# Patient Record
Sex: Female | Born: 1975 | Race: Black or African American | Hispanic: No | Marital: Married | Smoking: Never smoker
Health system: Southern US, Community
[De-identification: ages and names within clinical notes are randomized; demographics above are authoritative.]

## PROBLEM LIST (undated history)

## (undated) DIAGNOSIS — R7303 Prediabetes: Secondary | ICD-10-CM

## (undated) DIAGNOSIS — M1990 Unspecified osteoarthritis, unspecified site: Secondary | ICD-10-CM

## (undated) DIAGNOSIS — E559 Vitamin D deficiency, unspecified: Secondary | ICD-10-CM

## (undated) DIAGNOSIS — M549 Dorsalgia, unspecified: Secondary | ICD-10-CM

## (undated) HISTORY — DX: Unspecified osteoarthritis, unspecified site: M19.90

## (undated) HISTORY — DX: Prediabetes: R73.03

## (undated) HISTORY — PX: CHOLECYSTECTOMY: SHX55

## (undated) HISTORY — DX: Vitamin D deficiency, unspecified: E55.9

## (undated) HISTORY — PX: OTHER SURGICAL HISTORY: SHX169

## (undated) HISTORY — DX: Morbid (severe) obesity due to excess calories: E66.01

---

## 2000-02-21 ENCOUNTER — Inpatient Hospital Stay (HOSPITAL_COMMUNITY): Admission: AD | Admit: 2000-02-21 | Discharge: 2000-02-21 | Payer: Self-pay | Admitting: Obstetrics

## 2000-02-21 ENCOUNTER — Encounter: Payer: Self-pay | Admitting: Obstetrics & Gynecology

## 2000-02-26 ENCOUNTER — Other Ambulatory Visit: Admission: RE | Admit: 2000-02-26 | Discharge: 2000-02-26 | Payer: Self-pay | Admitting: Obstetrics

## 2000-05-13 ENCOUNTER — Inpatient Hospital Stay (HOSPITAL_COMMUNITY): Admission: AD | Admit: 2000-05-13 | Discharge: 2000-05-13 | Payer: Self-pay | Admitting: Obstetrics

## 2000-06-02 ENCOUNTER — Inpatient Hospital Stay (HOSPITAL_COMMUNITY): Admission: AD | Admit: 2000-06-02 | Discharge: 2000-06-02 | Payer: Self-pay | Admitting: Obstetrics

## 2000-06-08 ENCOUNTER — Inpatient Hospital Stay (HOSPITAL_COMMUNITY): Admission: AD | Admit: 2000-06-08 | Discharge: 2000-06-10 | Payer: Self-pay | Admitting: Obstetrics

## 2001-12-27 ENCOUNTER — Inpatient Hospital Stay (HOSPITAL_COMMUNITY): Admission: AD | Admit: 2001-12-27 | Discharge: 2001-12-27 | Payer: Self-pay | Admitting: Obstetrics & Gynecology

## 2001-12-27 ENCOUNTER — Encounter: Payer: Self-pay | Admitting: Obstetrics

## 2002-01-28 ENCOUNTER — Other Ambulatory Visit: Admission: RE | Admit: 2002-01-28 | Discharge: 2002-01-28 | Payer: Self-pay | Admitting: Obstetrics and Gynecology

## 2002-04-12 ENCOUNTER — Inpatient Hospital Stay (HOSPITAL_COMMUNITY): Admission: AD | Admit: 2002-04-12 | Discharge: 2002-04-12 | Payer: Self-pay | Admitting: Obstetrics and Gynecology

## 2002-05-24 ENCOUNTER — Inpatient Hospital Stay (HOSPITAL_COMMUNITY): Admission: AD | Admit: 2002-05-24 | Discharge: 2002-05-24 | Payer: Self-pay | Admitting: Obstetrics and Gynecology

## 2002-06-14 ENCOUNTER — Inpatient Hospital Stay (HOSPITAL_COMMUNITY): Admission: AD | Admit: 2002-06-14 | Discharge: 2002-06-14 | Payer: Self-pay | Admitting: Obstetrics and Gynecology

## 2002-06-20 ENCOUNTER — Inpatient Hospital Stay (HOSPITAL_COMMUNITY): Admission: AD | Admit: 2002-06-20 | Discharge: 2002-06-24 | Payer: Self-pay | Admitting: Obstetrics and Gynecology

## 2002-06-20 ENCOUNTER — Encounter (INDEPENDENT_AMBULATORY_CARE_PROVIDER_SITE_OTHER): Payer: Self-pay | Admitting: Specialist

## 2002-06-21 ENCOUNTER — Encounter: Payer: Self-pay | Admitting: Obstetrics and Gynecology

## 2002-06-22 ENCOUNTER — Encounter: Payer: Self-pay | Admitting: Obstetrics and Gynecology

## 2002-06-23 ENCOUNTER — Encounter: Payer: Self-pay | Admitting: Obstetrics and Gynecology

## 2002-06-27 ENCOUNTER — Encounter: Payer: Self-pay | Admitting: Obstetrics and Gynecology

## 2002-06-27 ENCOUNTER — Inpatient Hospital Stay (HOSPITAL_COMMUNITY): Admission: AD | Admit: 2002-06-27 | Discharge: 2002-06-27 | Payer: Self-pay | Admitting: Obstetrics and Gynecology

## 2002-11-06 ENCOUNTER — Emergency Department (HOSPITAL_COMMUNITY): Admission: EM | Admit: 2002-11-06 | Discharge: 2002-11-06 | Payer: Self-pay | Admitting: Emergency Medicine

## 2003-06-29 ENCOUNTER — Emergency Department (HOSPITAL_COMMUNITY): Admission: AD | Admit: 2003-06-29 | Discharge: 2003-06-29 | Payer: Self-pay | Admitting: Emergency Medicine

## 2006-02-02 ENCOUNTER — Emergency Department (HOSPITAL_COMMUNITY): Admission: EM | Admit: 2006-02-02 | Discharge: 2006-02-02 | Payer: Self-pay | Admitting: Family Medicine

## 2006-08-11 ENCOUNTER — Emergency Department (HOSPITAL_COMMUNITY): Admission: EM | Admit: 2006-08-11 | Discharge: 2006-08-11 | Payer: Self-pay | Admitting: Emergency Medicine

## 2007-11-30 ENCOUNTER — Emergency Department (HOSPITAL_COMMUNITY): Admission: EM | Admit: 2007-11-30 | Discharge: 2007-11-30 | Payer: Self-pay | Admitting: Emergency Medicine

## 2008-06-14 ENCOUNTER — Emergency Department (HOSPITAL_COMMUNITY): Admission: EM | Admit: 2008-06-14 | Discharge: 2008-06-14 | Payer: Self-pay | Admitting: Emergency Medicine

## 2008-06-17 ENCOUNTER — Inpatient Hospital Stay (HOSPITAL_COMMUNITY): Admission: EM | Admit: 2008-06-17 | Discharge: 2008-06-19 | Payer: Self-pay | Admitting: Emergency Medicine

## 2008-06-18 ENCOUNTER — Encounter (INDEPENDENT_AMBULATORY_CARE_PROVIDER_SITE_OTHER): Payer: Self-pay | Admitting: General Surgery

## 2009-04-11 ENCOUNTER — Emergency Department (HOSPITAL_COMMUNITY): Admission: EM | Admit: 2009-04-11 | Discharge: 2009-04-11 | Payer: Self-pay | Admitting: Family Medicine

## 2009-08-07 ENCOUNTER — Emergency Department (HOSPITAL_COMMUNITY): Admission: EM | Admit: 2009-08-07 | Discharge: 2009-08-07 | Payer: Self-pay | Admitting: Family Medicine

## 2009-11-08 ENCOUNTER — Emergency Department (HOSPITAL_COMMUNITY): Admission: EM | Admit: 2009-11-08 | Discharge: 2009-11-08 | Payer: Self-pay | Admitting: Emergency Medicine

## 2010-08-07 ENCOUNTER — Emergency Department (HOSPITAL_COMMUNITY): Admission: EM | Admit: 2010-08-07 | Discharge: 2010-08-07 | Payer: Self-pay | Admitting: Family Medicine

## 2011-02-13 LAB — CULTURE, ROUTINE-ABSCESS

## 2011-03-04 LAB — DIFFERENTIAL
Basophils Relative: 1 % (ref 0–1)
Eosinophils Absolute: 0.2 10*3/uL (ref 0.0–0.7)
Eosinophils Relative: 2 % (ref 0–5)
Monocytes Relative: 9 % (ref 3–12)
Neutrophils Relative %: 57 % (ref 43–77)

## 2011-03-04 LAB — POCT I-STAT, CHEM 8
Calcium, Ion: 1.17 mmol/L (ref 1.12–1.32)
Creatinine, Ser: 0.6 mg/dL (ref 0.4–1.2)
Glucose, Bld: 97 mg/dL (ref 70–99)
Potassium: 4 mEq/L (ref 3.5–5.1)
TCO2: 27 mmol/L (ref 0–100)

## 2011-03-04 LAB — CBC
HCT: 36 % (ref 36.0–46.0)
MCHC: 34 g/dL (ref 30.0–36.0)
MCV: 87.9 fL (ref 78.0–100.0)
RBC: 4.1 MIL/uL (ref 3.87–5.11)

## 2011-03-07 LAB — GLUCOSE, CAPILLARY: Glucose-Capillary: 129 mg/dL — ABNORMAL HIGH (ref 70–99)

## 2011-03-11 LAB — CULTURE, ROUTINE-ABSCESS: Culture: NO GROWTH

## 2011-04-15 NOTE — H&P (Signed)
Christina Booth, Christina Booth NO.:  [id number]   MEDICAL RECORD NO.:  [id number]          PATIENT TYPE:  INP   LOCATION:  5123                         FACILITY:  [facility]   PHYSICIAN:  Lennie Muckle, MD      DATE OF BIRTH:  1976/02/04   DATE OF ADMISSION:  06/17/2008  DATE OF DISCHARGE:                              HISTORY & PHYSICAL   DIAGNOSIS:  Acute cholecystitis.   HISTORY OF PRESENT ILLNESS:  Ms. Steller is a 35 year old female who  apparently began having abdominal pain located in the right upper  quadrant on Wednesday.  She did have a CT scan was which revealed  cholelithiasis with no abdominal wall thickening.  She received IV  medications and oral medications and was discharged home.  She said the  pain continued in the location in the right upper quadrant, did radiate  to her lower back, intermittently was increased at times.  Did not feel  it was increased by eating certain foods.  She did began to have chills  at home and also had associated nausea.  Repeat CT today revealed a  gallbladder wall thickening.  Ultrasound did not reveal any significant  wall thickening, but she did have numerous stones in gallbladder wall,  the common bile duct was normal at 5.9.   PAST MEDICAL HISTORY:  Negative.   SURGICAL HISTORY:  She did have a C-section.   FAMILY HISTORY:  Hyperlipidemia.   MEDICATIONS:  None.   ALLERGIES:  No allergies.   REVIEW OF SYSTEMS:  Negative.   PHYSICAL EXAMINATION:  VITAL SIGNS:  Original temperature at 2:20 was  100.7, 7:30 was 98.5, pulse 83, blood pressure 121/70.  GENERAL:  She is an obese female laying in bed, appears uncomfortable.  HEENT EXAM:  Extraocular muscles are intact and icteric.  Oral mucosa is  moist.  Chest is clear to auscultation bilaterally.  CARDIOVASCULAR EXAM:  Regular rate and rhythm.  ABDOMEN:  Obese, soft.  It is tender in the right upper quadrant, worse  with deep palpation.   LABORATORY DATA:  White count  9.8, hemoglobin and hematocrit 12 and 35,  BUN and creatinine 5 and 0.9.  Liver enzymes, her AST, ALT are mildly  elevated at 59 and 43, bilirubin 1.2, albumin 3.4.  UA is cloudy.  There  is a small amount of bilirubin, large amount of blood.   ASSESSMENT/PLAN:  Acute cholecystitis, admit with IV antibiotics of  Cipro.  Have discussed laparoscopic cholecystectomy with the patient  likely proceed to the operating room tomorrow or the following day.  She  will be kept n.p.o. until then and resuscitate with IV fluids.  Repeat  labs tomorrow to ensure that there is no other trend up in liver  enzymes.      Lennie Muckle, MD  Electronically Signed     ALA/MEDQ  D:  06/17/2008  T:  06/18/2008  Job:  [id number]

## 2011-04-15 NOTE — Op Note (Signed)
NAMESHINEKA, AUBLE NO.:  [id number]   MEDICAL RECORD NO.:  [id number]          PATIENT TYPE:  INP   LOCATION:  5123                         FACILITY:  [facility]   PHYSICIAN:  Ollen Gross. Vernell Morgans, M.D. DATE OF BIRTH:  1976/07/18   DATE OF PROCEDURE:  06/18/2008  DATE OF DISCHARGE:                               OPERATIVE REPORT   PREOPERATIVE DIAGNOSES:  Cholecystitis with cholelithiasis.   POSTOPERATIVE DIAGNOSES:  Cholecystitis with cholelithiasis.   PROCEDURE:  Laparoscopic cholecystectomy with intraoperative  cholangiogram.   SURGEON:  Ollen Gross. Vernell Morgans, MD   ASSISTANT:  Adolph Pollack, MD   ANESTHESIA:  General endotracheal.   PROCEDURE:  After informed consent was obtained, the patient was brought  to the operating room and placed in supine position on the operating  table.  After induction of general anesthesia, the patient's abdomen was  prepped with Betadine and draped in usual sterile manner.  The area  above the umbilicus was infiltrated with 0.25% Marcaine.  Small incision  was made with 15 blade knife.  This incision was carried down through  the subcutaneous tissue bluntly with the hemostat and [employer]  retractors until the linea alba was identified.  The linea alba was  incised with a 15 blade knife and each side was grasped with Kocher  clamps and elevated anteriorly.  The preperitoneal space was then probed  bluntly with hemostat until the peritoneum was opened and access was  gained to the abdominal cavity.  A 0 Vicryl pursestring stitch was  placed in the fascia around the opening.  Xylocaine was placed through  the opening and anchored in place with previously placed Vicryl  pursestring stitch.  The abdomen was then insufflated with carbon  dioxide without difficulty.  The patient was placed in reverse  Trendelenburg position rotated with the right side up.  A laparoscope  was inserted through the Xylocaine and the right upper quadrant  was  inspected and the gallbladder appeared to be very inflamed with  adhesions of the omentum to it.  Next, the epigastric region was  infiltrated with 0.25% Marcaine.  A small incision was made with 15  blade knife and a 10-mm port was placed bluntly through this incision  into the abdominal cavity under direct vision.  Sites were then chosen  laterally on the right side of the abdomen for placement of 5-mm ports.  Each of these areas were then infiltrated with 0.25% Marcaine.  Small  stab incisions were made with a 15-blade knife and 5-mm ports were  placed bluntly through these incisions into the abdominal cavity under  direct vision.  A blunt grasper was placed through the lateral most 5-mm  port.  The gallbladder was too tense and inflamed to grasp.  A suction  aspirating device was then placed through the other 5-mm port and used  to puncture the dome of the gallbladder.  The contents of the  gallbladder were then aspirated without difficulty.  This allowed [place] to  be able to grasp the gallbladder and elevated anteriorly and superiorly.  The suction aspirator was removed and another  blunt grasper was placed  through the other 5-mm port and used to retract on the body and neck of  the gallbladder.  A dissector was placed through the epigastric port.  Using blunt dissection, the adhesions of the omentum to the body of the  gallbladder were broken up.  We were able to then identify the neck area  of the gallbladder, and using a blunt hydrodissection, we were able to  bluntly identify the cystic duct gallbladder neck junction as well as  the cystic artery posterior to this.  Blunt dissection was carried out  circumferentially until a good window was created.  Two clips were  placed proximally and one clip distally on the cystic artery.  A clip  was also placed on the gallbladder neck.  A small ductotomy was made  just below the clip.  A 14-gauge Angiocath was placed percutaneously   through the anterior abdominal wall under direct vision.  A Reddick  cholangiogram catheter was placed through the Angiocath and flushed.  The Reddick catheter was then placed within the cystic duct and anchored  in place with the clip.  The cholangiogram was obtained that showed no  filling defects, good emptying in duodenum, and short but adequate  cystic duct.  The anchoring clips and catheters were removed from the  patient.  Three clips were placed proximally in the cystic duct and duct  was divided between the two sets of clips.  The cystic artery was also  divided between the two sets of clips.  Next, a laparoscopic hook  cautery device was used to separate the gallbladder from liver bed.  Prior to completely detaching the gallbladder from liver bed, the liver  bed was inspected and several small bleeding points were coagulated with  electrocautery until the area was completely hemostatic.  The  gallbladder was then detached from the risk away from the liver bed  without difficulty.  A laparoscopic bag was inserted through the  epigastric port.  The gallbladder was placed in the bag and bag was  sealed.  The abdomen was then irrigated with copious amounts of saline  until the effluent was clear.  The laparoscope was then moved.  The  epigastric port of gallbladder grasper was placed through the Hasson  cannula and used to grasp the opening of the bag.  The bag with the  gallbladder was removed through the supraumbilical port without much  difficulty, although we did have to extend the incision just slightly  another millimeter or two to get it out.  The fascial defect was then  closed with a previously placed Vicryl pursestring stitch as well as  with another figure-of-eight 0 Vicryl stitch.  The rest of the ports  were then removed under direct vision.  The gas was allowed to escape.  This skin incisions were all closed with interrupted 4-0 Monocryl  subcuticular stitches and  Dermabond dressings were applied.  The patient  tolerated the procedure well.  At the end of the case, all needle,  sponge, and instrument counts were correct.  The patient was then  awakened and taken to recovery room in stable condition.      Ollen Gross. Vernell Morgans, M.D.  Electronically Signed     PST/MEDQ  D:  06/18/2008  T:  06/19/2008  Job:  [id number]

## 2011-04-18 NOTE — Discharge Summary (Signed)
NAMEMERYN, SARRACINO                       ACCOUNT NO.:  [id number]   MEDICAL RECORD NO.:  [id number]                   PATIENT TYPE:   LOCATION:                                       FACILITY:  [facility]   PHYSICIAN:  Rudy Jew. Ashley Royalty, M.D.             DATE OF BIRTH:  1976/04/01   DATE OF ADMISSION:  06/20/2002  DATE OF DISCHARGE:  06/24/2002                                 DISCHARGE SUMMARY   DISCHARGE DIAGNOSES:  1. Intrauterine pregnancy at 39+ weeks gestation, delivered.  2. History of macrosomia with shoulder dystocia at vaginal delivery,     including brachial plexus injury.  3. History of Kell antibody during current pregnancy with titers     insufficient to warrant amniocentesis.  4. Desire for attempt at permanent surgical sterilization.  5. Atelectasis - improved.   SPECIAL PROCDURES:  1. Primary low transverse cesarean section.  2. Bilateral tubal sterilization procedure.   CONSULTATIONS:  None.   DISCHARGE MEDICATIONS:  1. Codeine 30 mg q.4-6h. p.r.n. pain.  2. Ibuprofen 600 mg q.6h. p.r.n. pain.   HISTORY AND PHYSICAL:  This is a 35 year old gravida 4 para 3, 39+ weeks  gestation with the afore-mentioned risk factors.  The patient was admitted  for primary low transverse cesarean section and bilateral tubal  sterilization procedure.   HOSPITAL COURSE:  The patient was admitted to [facility]  [place].  Admission laboratory studies were drawn.  On June 20, 2002 she  was taken to the operating room and underwent primary low transverse  cesarean section and bilateral tubal sterilization procedure.  The procedure  yielded a 7 pound 9 ounce female, Apgars 7 at one minute, 9 at five minutes,  sent to newborn nursery.  The patient's postoperative course was  characterized by a transient fever.  X-ray revealed atelectasis.  Antibiotics were initiated and the patient subsequently defervesced.  On  June 24, 2002 she was felt stable for discharge and  discharged home in  satisfactory condition.   ACCESSORY CLINICAL FINDINGS:  Hemoglobin and hematocrit on admission were  9.7 and 30.8 respectively.  Repeat values were obtained throughout the  hospitalization, the last which were obtained on June 23, 2002 and were 9.6  and 29.5 respectively.  White blood count did not exceed 10,000 at any time.  Urine culture was negative.  X-ray  on June 22, 2002 revealed atelectasis or early infiltrates in the right lung  base.  This improved over subsequent studies.   DISPOSITION:  The patient is to return to [facility] in four to six  weeks for evaluation.                                                James A. Ashley Royalty, M.D.    JAM/MEDQ  D:  09/20/2002  T:  09/21/2002  Job:  [id number]

## 2011-04-18 NOTE — Op Note (Signed)
[facility]  Patient:    Christina Booth, Christina Booth Visit Number: [id number] MRN: [id number]          Service Type: OBS Location: 910A 9102 [id number] Attending Physician:  Wandalee Ferdinand Dictated by:   Rudy Jew Ashley Royalty, M.D. Proc. Date: 06/20/02 Admit Date:  06/20/2002                             Operative Report  PREOPERATIVE DIAGNOSES:       1. Intrauterine pregnancy at 39+ weeks                                  gestation.                               2. 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                               3. Desire for attempt at permanent surgical                                  sterilization.  POSTOPERATIVE DIAGNOSES:      1. Intrauterine pregnancy at 39+ weeks                                  gestation.                               2. 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                               3. Desire for attempt at permanent surgical                                  sterilization.  PROCEDURE:                    1. Primary low transverse cesarean section.                               2. Bilateral tubal sterilization procedure,                                  [place].  SURGEON:                      Rudy Jew. Ashley Royalty, M.D.  ASSISTANT:  Ronda Fairly. Galen Daft, M.D.  ANESTHESIA:                   Spinal.  ESTIMATED BLOOD LOSS:         800 cc.  FINDINGS:                     A 7 pound 9 ounce female.  Apgars 7 at one minute, 9 at five minutes.  Sent to the newborn nursery.  COMPLICATIONS:                None.  PACKS AND DRAINS:             Foley.  COUNTS:                       Sponge, needle, and instrument count reported as correct x2.  PROCEDURE:                    The patient was taken to the operating room, placed in the sitting position.  After spinal  anesthesia was administered she was placed in the dorsal supine position, prepped and draped in the usual manner for abdominal surgery.  Foley catheter was placed.  A Pfannenstiel incision was made down to the level of the fascia.  The fascia was nicked in the midline and incised transversely with Mayo scissors.  The underlying rectus muscles were separated from the fascia using sharp and blunt dissection.  The rectus muscles were separated in the midline exposing the peritoneum which was elevated with hemostats and entered atraumatically with Metzenbaum scissors.  The incision was extended longitudinally.  The uterus was identified and the bladder flap created by incising the intrauterine serosa and sharply and bluntly dissecting the bladder inferiorly.  It was held in place with the bladder blade.  The uterus was then entered through a low transverse incision using sharp and blunt dissection.  Fluid was noted to be clear.  The infant was delivered from the vertex presentation in an atraumatic manner.  The infant was suctioned.  The cord was doubly clamped, cut, and the infant given immediately to the waiting pediatrics team.  Cord blood was obtained.  The placenta and membranes were removed in their entirety and submitted to pathology for histologic studies.  Uterus was exteriorized.  The uterus was then closed with two running layers of #1 Vicryl, the first using a running locking layer.  The second was a running, intermittently locking, and embrocating layer.  Two additional figure-of-eight sutures were required to obtain hemostasis.  Hemostasis was noted.  Attention was turned to the tubal sterilization procedure.  The left fallopian tube was grasped with the Babcock clamp and traced to its fimbriated end.  An avascular area in the distal isthmic to proximal ampullary portion was chosen for ligation.  A #1 plain catgut suture was placed around a segment of tube in this area and tied  securely.  A second ligature was tied in the same area as well.  The intervening portion of fallopian tube was excised with Metzenbaum scissors and submitted to pathology for histologic studies.  The right fallopian tube was grasped with a Babcock clamp and traced to its fimbriated end.  An avascular area in the distal isthmic proximal ampullary portion was chosen for ligation.  A #1 plain catgut suture was placed as a ligature around this segment of tube.  A second ligature was placed securely.  The intervening portion  of fallopian tube was excised with Metzenbaum scissors and submitted to pathology for histologic studies.  Hemostasis was noted.  The remainder of the uterus and ovaries were inspected and found to be normal.  The uterus was then returned to the abdominal cavity.  Copious irrigation was accomplished. The incision was dry.  The tubal sterilization sites were dry as well.  Next, the rectus muscles were reapproximated with 2-0 Vicryl in an interrupted fashion.  The fascia was closed with 0 Vicryl in a running fashion.  The skin was closed with staples.  The patient tolerated the procedure extremely well. At the conclusion of the procedure the urine was clear and copious. Dictated by:   Rudy Jew Ashley Royalty, M.D. Attending Physician:  Wandalee Ferdinand DD:  06/20/02 TD:  06/23/02 Job: [id number]

## 2011-04-18 NOTE — H&P (Signed)
[facility]  Patient:    Christina Booth, Christina Booth Visit Number: [id number] MRN: [id number]          Service Type: OBS Location: 910A 9102 01 Attending Physician:  Wandalee Ferdinand Dictated by:   Rudy Jew Ashley Royalty, M.D. Admit Date:  06/20/2002                           History and Physical  HISTORY OF PRESENT ILLNESS:   This is a 35 year old, gravida 4, para 3, EDC June 25, 2002 placing her currently at 39 plus weeks gestation. Prenatal care was complicated by an antibody screen February 2003 which revealed a "nonspecific cold" antibody. Subsequent identification revealed Kell. However, subsequent titers were sufficiently low as to not require amniocentesis. The patient also has a history of a spontaneous vaginal delivery with shoulder dystocia including a brachioplexus injury. She also states a desire for attempt at permanent surgical sterilization.  MEDICATIONS:                  Vitamins.  PAST MEDICAL HISTORY:         Medical negative. Surgical negative.  ALLERGIES:                    Negative.  FAMILY HISTORY:               Positive for hypertension, diabetes, and breast cancer.  SOCIAL HISTORY:               The patient denies the use of tobacco or significant alcohol.  REVIEW OF SYSTEMS:            Noncontributory.  PHYSICAL EXAMINATION:  GENERAL:                      Reveals a pleasant female in no acute distress.  VITAL SIGNS:                  Afebrile, vital signs stable.  SKIN:                         Warm and dry without lesions.  LYMPH NODES:                  No supraclavicular, cervical or inguinal adenopathy.  HEENT:                        Normocephalic.  NECK:                         Sore without thyromegaly.  CHEST:                        Lungs are clear.  CARDIAC:                      Regular rate and rhythm without murmurs, gallops or rubs.  BREASTS:                      Deferred.  ABDOMEN:                       Gravid with a term fundal height. Fetal heart tones are auscultated.  MUSCULOSKELETAL:              No CVA tenderness.  PELVIC:                       Deferred until examination under anesthesia.  IMPRESSION:                   1. Intrauterine pregnancy at 39 plus weeks                                  gestation.                               2. History of macrosomia with shoulder dystocia                                  at vaginal delivery including brachioplexus                                  injury.                               3. History of Kell antibody during current                                  pregnancy with titers insufficient to warrant                                  amniocentesis.                               4. Desire for attempt at permanent surgical                                  sterilization.  PLAN:                         1. Primary low transverse cesarean section.                               2. Bilateral tubal sterilization procedure.  The risks, benefits, complications, and alternatives were fully discussed with the patient. Permanency and failure rates of tubal sterilization procedure were discussed and accepted. Questions invited and answered. Dictated by:   Rudy Jew Ashley Royalty, M.D. Attending Physician:  Wandalee Ferdinand DD:  06/20/02 TD:  06/20/02 Job: [id number]

## 2011-07-31 ENCOUNTER — Emergency Department (HOSPITAL_COMMUNITY): Payer: Self-pay

## 2011-07-31 ENCOUNTER — Emergency Department (HOSPITAL_COMMUNITY)
Admission: EM | Admit: 2011-07-31 | Discharge: 2011-07-31 | Disposition: A | Discharge status: Home / Self Care | Payer: Self-pay | Attending: Emergency Medicine | Admitting: Emergency Medicine

## 2011-07-31 DIAGNOSIS — R319 Hematuria, unspecified: Secondary | ICD-10-CM | POA: Insufficient documentation

## 2011-07-31 DIAGNOSIS — R0602 Shortness of breath: Secondary | ICD-10-CM | POA: Insufficient documentation

## 2011-07-31 DIAGNOSIS — R197 Diarrhea, unspecified: Secondary | ICD-10-CM | POA: Insufficient documentation

## 2011-07-31 DIAGNOSIS — E669 Obesity, unspecified: Secondary | ICD-10-CM | POA: Insufficient documentation

## 2011-07-31 DIAGNOSIS — R5381 Other malaise: Secondary | ICD-10-CM | POA: Insufficient documentation

## 2011-07-31 DIAGNOSIS — R42 Dizziness and giddiness: Secondary | ICD-10-CM | POA: Insufficient documentation

## 2011-07-31 DIAGNOSIS — R000 Tachycardia, unspecified: Secondary | ICD-10-CM | POA: Insufficient documentation

## 2011-07-31 DIAGNOSIS — R454 Irritability and anger: Secondary | ICD-10-CM | POA: Insufficient documentation

## 2011-07-31 DIAGNOSIS — R110 Nausea: Secondary | ICD-10-CM | POA: Insufficient documentation

## 2011-07-31 LAB — URINALYSIS, ROUTINE W REFLEX MICROSCOPIC
Bilirubin Urine: NEGATIVE
Protein, ur: NEGATIVE mg/dL
Specific Gravity, Urine: 1.023 (ref 1.005–1.030)
Urobilinogen, UA: 0.2 mg/dL (ref 0.0–1.0)

## 2011-07-31 LAB — POCT I-STAT, CHEM 8
Calcium, Ion: 1.19 mmol/L (ref 1.12–1.32)
Creatinine, Ser: 0.7 mg/dL (ref 0.50–1.10)
Glucose, Bld: 102 mg/dL — ABNORMAL HIGH (ref 70–99)
Hemoglobin: 11.9 g/dL — ABNORMAL LOW (ref 12.0–15.0)
TCO2: 24 mmol/L (ref 0–100)

## 2011-07-31 LAB — URINE MICROSCOPIC-ADD ON

## 2011-08-29 LAB — URINALYSIS, ROUTINE W REFLEX MICROSCOPIC
Glucose, UA: NEGATIVE
Glucose, UA: NEGATIVE
Leukocytes, UA: NEGATIVE
Nitrite: NEGATIVE
Protein, ur: 100 — AB
Protein, ur: 100 — AB
Urobilinogen, UA: 4 — ABNORMAL HIGH

## 2011-08-29 LAB — CBC
HCT: 31 — ABNORMAL LOW
HCT: 35.3 — ABNORMAL LOW
Hemoglobin: 12.1
MCHC: 33.6
MCHC: 34.3
MCHC: 34.3
MCV: 87.6
MCV: 88.3
Platelets: 257
Platelets: 272
RDW: 14.5
RDW: 14.9
RDW: 14.9

## 2011-08-29 LAB — DIFFERENTIAL
Basophils Relative: 1
Eosinophils Absolute: 0
Eosinophils Relative: 0
Lymphs Abs: 2
Monocytes Relative: 11
Monocytes Relative: 7
Neutro Abs: 5.5
Neutrophils Relative %: 57

## 2011-08-29 LAB — COMPREHENSIVE METABOLIC PANEL
Albumin: 2.8 — ABNORMAL LOW
Alkaline Phosphatase: 75
BUN: 5 — ABNORMAL LOW
BUN: 7
Calcium: 8.6
Calcium: 9.2
Creatinine, Ser: 0.93
Glucose, Bld: 100 — ABNORMAL HIGH
Glucose, Bld: 108 — ABNORMAL HIGH
Potassium: 3.7
Total Protein: 6.3
Total Protein: 7.4

## 2011-08-29 LAB — GC/CHLAMYDIA PROBE AMP, GENITAL
Chlamydia, DNA Probe: NEGATIVE
GC Probe Amp, Genital: NEGATIVE

## 2011-08-29 LAB — WET PREP, GENITAL
WBC, Wet Prep HPF POC: NONE SEEN
Yeast Wet Prep HPF POC: NONE SEEN

## 2011-08-29 LAB — LIPASE, BLOOD: Lipase: 16

## 2011-08-29 LAB — BASIC METABOLIC PANEL
BUN: 8
CO2: 21
Chloride: 106
Creatinine, Ser: 0.76
Glucose, Bld: 138 — ABNORMAL HIGH

## 2011-08-29 LAB — URINE MICROSCOPIC-ADD ON

## 2011-08-29 LAB — POCT PREGNANCY, URINE
Operator id: 29502
Preg Test, Ur: NEGATIVE

## 2011-08-29 LAB — URINE CULTURE

## 2012-09-03 ENCOUNTER — Encounter (HOSPITAL_COMMUNITY): Payer: Self-pay

## 2012-09-03 ENCOUNTER — Emergency Department (INDEPENDENT_AMBULATORY_CARE_PROVIDER_SITE_OTHER)
Admission: EM | Admit: 2012-09-03 | Discharge: 2012-09-03 | Disposition: A | Discharge status: Home / Self Care | Payer: Self-pay | Source: Home / Self Care

## 2012-09-03 ENCOUNTER — Encounter (HOSPITAL_COMMUNITY): Payer: Self-pay | Admitting: *Deleted

## 2012-09-03 ENCOUNTER — Emergency Department (HOSPITAL_COMMUNITY)
Admission: EM | Admit: 2012-09-03 | Discharge: 2012-09-04 | Disposition: A | Discharge status: Home / Self Care | Payer: Self-pay | Attending: Emergency Medicine | Admitting: Emergency Medicine

## 2012-09-03 DIAGNOSIS — N3000 Acute cystitis without hematuria: Secondary | ICD-10-CM

## 2012-09-03 DIAGNOSIS — Z9089 Acquired absence of other organs: Secondary | ICD-10-CM | POA: Insufficient documentation

## 2012-09-03 DIAGNOSIS — N390 Urinary tract infection, site not specified: Secondary | ICD-10-CM

## 2012-09-03 DIAGNOSIS — N12 Tubulo-interstitial nephritis, not specified as acute or chronic: Secondary | ICD-10-CM

## 2012-09-03 DIAGNOSIS — R109 Unspecified abdominal pain: Secondary | ICD-10-CM | POA: Insufficient documentation

## 2012-09-03 DIAGNOSIS — R52 Pain, unspecified: Secondary | ICD-10-CM

## 2012-09-03 DIAGNOSIS — R10819 Abdominal tenderness, unspecified site: Secondary | ICD-10-CM | POA: Insufficient documentation

## 2012-09-03 DIAGNOSIS — R1084 Generalized abdominal pain: Secondary | ICD-10-CM

## 2012-09-03 DIAGNOSIS — N10 Acute tubulo-interstitial nephritis: Secondary | ICD-10-CM

## 2012-09-03 LAB — CBC WITH DIFFERENTIAL/PLATELET
Eosinophils Absolute: 0.1 10*3/uL (ref 0.0–0.7)
Hemoglobin: 11.6 g/dL — ABNORMAL LOW (ref 12.0–15.0)
Lymphocytes Relative: 34 % (ref 12–46)
Lymphs Abs: 3.7 10*3/uL (ref 0.7–4.0)
MCH: 29.3 pg (ref 26.0–34.0)
MCV: 85.6 fL (ref 78.0–100.0)
Monocytes Relative: 6 % (ref 3–12)
Neutrophils Relative %: 59 % (ref 43–77)
RBC: 3.96 MIL/uL (ref 3.87–5.11)
WBC: 10.8 10*3/uL — ABNORMAL HIGH (ref 4.0–10.5)

## 2012-09-03 LAB — POCT URINALYSIS DIP (DEVICE)
Bilirubin Urine: NEGATIVE
Glucose, UA: NEGATIVE mg/dL
Ketones, ur: NEGATIVE mg/dL
pH: 7.5 (ref 5.0–8.0)

## 2012-09-03 LAB — COMPREHENSIVE METABOLIC PANEL
ALT: 14 U/L (ref 0–35)
Alkaline Phosphatase: 81 U/L (ref 39–117)
BUN: 13 mg/dL (ref 6–23)
CO2: 26 mEq/L (ref 19–32)
GFR calc Af Amer: >90 mL/min (ref >90)
GFR calc non Af Amer: >90 mL/min (ref >90)
Glucose, Bld: 104 mg/dL — ABNORMAL HIGH (ref 70–99)
Potassium: 3.8 mEq/L (ref 3.5–5.1)
Sodium: 136 mEq/L (ref 135–145)
Total Bilirubin: 0.3 mg/dL (ref 0.3–1.2)
Total Protein: 8 g/dL (ref 6.0–8.3)

## 2012-09-03 MED ORDER — ONDANSETRON HCL 4 MG/2ML IJ SOLN
4.0000 mg | Freq: Once | INTRAMUSCULAR | Status: AC
  Administered 2012-09-03: 4 mg via INTRAVENOUS
  Filled 2012-09-03: qty 2

## 2012-09-03 MED ORDER — HYDROMORPHONE HCL PF 1 MG/ML IJ SOLN
1.0000 mg | Freq: Once | INTRAMUSCULAR | Status: AC
  Administered 2012-09-03: 1 mg via INTRAVENOUS
  Filled 2012-09-03: qty 1

## 2012-09-03 MED ORDER — SODIUM CHLORIDE 0.9 % IV BOLUS (SEPSIS)
1000.0000 mL | Freq: Once | INTRAVENOUS | Status: AC
  Administered 2012-09-03: 1000 mL via INTRAVENOUS

## 2012-09-03 NOTE — ED Notes (Addendum)
Pt states she went to urgent care today, dx with UTI and they told her she needed IV antibiotics "because my uti is affecting my kidneys." Pt also c/o left flank pain, n/v x1.

## 2012-09-03 NOTE — ED Provider Notes (Signed)
Medical screening examination/treatment/procedure(s) were performed by non-physician practitioner and as supervising physician I was immediately available for consultation/collaboration.  Leslee Home, M.D.   Reuben Likes, MD 09/03/12 2055

## 2012-09-03 NOTE — ED Provider Notes (Signed)
History     CSN: [id number]  Arrival date & time 09/03/12  2012   First MD Initiated Contact with Patient 09/03/12 2303      Chief Complaint  Patient presents with  . Urinary Tract Infection  . Flank Pain    (Consider location/radiation/quality/duration/timing/severity/associated sxs/prior treatment) HPI Comments: 36 y/o female presents to ED from [facility] with low back pain radiating around to her lower abdomen for the past two weeks. Pain worsened yesterday. Describes pain as constant, sharp, rated 10/10, worse with sitting up. Tried taking OTC "urinary pills that turn urine orange" without any change in symptoms. Also took 3 aleve last night with only slight relief of back pain. Admits to associated nausea, vomiting, increased urinary frequency, and a "weird" feeling when completing her urination. She feels fatigued. Denies dysuria, hematuria, fever, chills, vaginal bleeding, pain or discharge, lightheadedness, dizziness. Denies any history of kidney stones. States she keeps herself well hydrated.  Patient is a 36 y.o. female presenting with urinary tract infection and flank pain. The history is provided by the patient.  Urinary Tract Infection Associated symptoms include abdominal pain, fatigue, nausea and vomiting. Pertinent negatives include no chest pain, chills, fever, neck pain or rash.  Flank Pain Associated symptoms include abdominal pain, fatigue, nausea and vomiting. Pertinent negatives include no chest pain, chills, fever, neck pain or rash.    History reviewed. No pertinent past medical history.  Past Surgical History  Procedure Date  . Cholecystectomy   . Btl     History reviewed. No pertinent family history.  History  Substance Use Topics  . Smoking status: Current Some Day Smoker  . Smokeless tobacco: Not on file  . Alcohol Use: No    OB History    Grav Para Term Preterm Abortions TAB SAB Ect Mult Living                  Review of Systems  Constitutional:  Positive for fatigue. Negative for fever and chills.  HENT: Negative for neck pain and neck stiffness.   Respiratory: Negative for shortness of breath.   Cardiovascular: Negative for chest pain.  Gastrointestinal: Positive for nausea, vomiting and abdominal pain.  Genitourinary: Positive for frequency and flank pain. Negative for dysuria, hematuria, decreased urine volume, vaginal bleeding, vaginal discharge, vaginal pain and menstrual problem.  Musculoskeletal: Positive for back pain.  Skin: Negative for color change and rash.  Neurological: Negative for dizziness and light-headedness.  Psychiatric/Behavioral: Negative for confusion. The patient is not nervous/anxious.     Allergies  Review of patient's allergies indicates no known allergies.  Home Medications   Current Outpatient Rx  Name Route Sig Dispense Refill  . NAPROXEN SODIUM 220 MG PO TABS Oral Take 660 mg by mouth daily as needed. For pain      BP 132/88  Pulse 83  Temp 98.7 F (37.1 C) (Oral)  Resp 18  SpO2 100%  LMP 08/27/2012  Physical Exam  Nursing note reviewed. Constitutional: She is oriented to person, place, and time. She appears well-developed and well-nourished. No distress.  HENT:  Head: Normocephalic.  Mouth/Throat: Oropharynx is clear and moist.  Eyes: Conjunctivae normal are normal.  Neck: Normal range of motion. Neck supple.  Cardiovascular: Normal rate, regular rhythm and normal heart sounds.   Pulmonary/Chest: Effort normal and breath sounds normal. No respiratory distress.  Abdominal: Soft. Normal appearance and bowel sounds are normal. She exhibits no mass. There is tenderness in the right lower quadrant, suprapubic area and left lower  quadrant. There is guarding and CVA tenderness (on left). There is no rigidity and no rebound.  Neurological: She is alert and oriented to person, place, and time.  Skin: Skin is warm and dry. She is not diaphoretic.  Psychiatric: She has a normal mood and  affect. Her behavior is normal.    ED Course  Procedures (including critical care time)   Labs Reviewed  CBC WITH DIFFERENTIAL  COMPREHENSIVE METABOLIC PANEL   Results for orders placed during the hospital encounter of 09/03/12  CBC WITH DIFFERENTIAL      Component Value Range   WBC 10.8 (*) 4.0 - 10.5 K/uL   RBC 3.96  3.87 - 5.11 MIL/uL   Hemoglobin 11.6 (*) 12.0 - 15.0 g/dL   HCT 16.1 (*) 09.6 - 04.5 %   MCV 85.6  78.0 - 100.0 fL   MCH 29.3  26.0 - 34.0 pg   MCHC 34.2  30.0 - 36.0 g/dL   RDW 40.9  81.1 - 91.4 %   Platelets 301  150 - 400 K/uL   Neutrophils Relative 59  43 - 77 %   Neutro Abs 6.4  1.7 - 7.7 K/uL   Lymphocytes Relative 34  12 - 46 %   Lymphs Abs 3.7  0.7 - 4.0 K/uL   Monocytes Relative 6  3 - 12 %   Monocytes Absolute 0.7  0.1 - 1.0 K/uL   Eosinophils Relative 1  0 - 5 %   Eosinophils Absolute 0.1  0.0 - 0.7 K/uL   Basophils Relative 0  0 - 1 %   Basophils Absolute 0.0  0.0 - 0.1 K/uL  COMPREHENSIVE METABOLIC PANEL      Component Value Range   Sodium 136  135 - 145 mEq/L   Potassium 3.8  3.5 - 5.1 mEq/L   Chloride 100  96 - 112 mEq/L   CO2 26  19 - 32 mEq/L   Glucose, Bld 104 (*) 70 - 99 mg/dL   BUN 13  6 - 23 mg/dL   Creatinine, Ser 7.82  0.50 - 1.10 mg/dL   Calcium 95.6  8.4 - 21.3 mg/dL   Total Protein 8.0  6.0 - 8.3 g/dL   Albumin 4.0  3.5 - 5.2 g/dL   AST 18  0 - 37 U/L   ALT 14  0 - 35 U/L   Alkaline Phosphatase 81  39 - 117 U/L   Total Bilirubin 0.3  0.3 - 1.2 mg/dL   GFR calc non Af Amer >90  >90 mL/min   GFR calc Af Amer >90  >90 mL/min    Ct Abdomen Pelvis Wo Contrast  09/04/2012  *RADIOLOGY REPORT*  Clinical Data: Pain.  Previous cholecystectomy.  CT ABDOMEN AND PELVIS WITHOUT CONTRAST  Technique:  Multidetector CT imaging of the abdomen and pelvis was performed following the standard protocol without intravenous contrast.  Comparison: 06/18/2008  Findings: Minimal dependent atelectasis in the visualized lung bases.  Vascular clips in  the gallbladder fossa.  Unremarkable uninfused evaluation of liver, spleen, adrenal glands, kidneys, pancreas, abdominal aorta, stomach, small bowel.  The colon is nondilated, unremarkable.  Uterus and adnexal regions unremarkable. Urinary bladder incompletely distended.  No ascites.  No free air. No adenopathy.  Normal appendix. Lumbar spine intact.  IMPRESSION:  1.  No acute disease post cholecystectomy.   Original Report Authenticated By: Thora Lance III, M.D.      1. Urinary tract infection   2. Pyelonephritis  MDM  36 y/o female sent over from [facility] presenting with worsening lower back/flank/abdominal pain associated with nausea and vomiting. Urine showing positive UTI. Patient tender in suprapubic region. Positive CVA tenderness on left. Back pain is what is bothering her the most. Will obtain CT scan without contrast. Pain/nausea control. 12:43 AM No stone or other acute abnormality seen on CT scan. Will treat for UTI/pyelonephritis with keflex x 10 days. Zofran given for nausea. Close return precautions discussed. Case discussed with Dr. Hyacinth Meeker who agrees with plan of care.       Trevor Mace, PA-C 09/04/12 [phone]

## 2012-09-03 NOTE — ED Provider Notes (Signed)
History     CSN: [id number]  Arrival date & time 09/03/12  1824   None     Chief Complaint  Patient presents with  . Abdominal Pain    (Consider location/radiation/quality/duration/timing/severity/associated sxs/prior treatment) HPI Comments: 36 year old female presents with pain in her left back, left lower quadrant and suprapubic/ pelvis. She's been having discomfort with urination for approximately one week. The left low back pain is  progressing in intensity and becoming generalized. The pain  is constant and often worse with certain movements. Nausea without vomiting, low-grade fever. She is experiencing profound malaise decreased energy level and fatigue. Her oral intake of fluids has diminished over the past several days.   History reviewed. No pertinent past medical history.  Past Surgical History  Procedure Date  . Cholecystectomy   . Btl     No family history on file.  History  Substance Use Topics  . Smoking status: Current Some Day Smoker  . Smokeless tobacco: Not on file  . Alcohol Use: No    OB History    Grav Para Term Preterm Abortions TAB SAB Ect Mult Living                  Review of Systems  Constitutional: Positive for fever, activity change and fatigue. Negative for diaphoresis.  HENT: Negative.   Respiratory: Negative.   Cardiovascular: Positive for chest pain. Negative for palpitations.  Gastrointestinal: Positive for nausea. Negative for vomiting and diarrhea.  Genitourinary: Positive for dysuria, frequency, decreased urine volume and pelvic pain. Negative for vaginal bleeding, vaginal discharge, vaginal pain and menstrual problem.  Musculoskeletal: Positive for myalgias and back pain. Negative for joint swelling.  Skin: Negative for color change and rash.  Neurological: Negative.     Allergies  Review of patient's allergies indicates no known allergies.  Home Medications  No current outpatient prescriptions on file.  BP 133/87   Pulse 74  Temp 99.4 F (37.4 C) (Oral)  Resp 20  SpO2 97%  LMP 08/27/2012  Physical Exam  Constitutional: She is oriented to person, place, and time. She appears well-developed.  Eyes: Conjunctivae normal and EOM are normal.  Neck: Normal range of motion. Neck supple.  Cardiovascular: Normal rate and normal heart sounds.   Pulmonary/Chest: Effort normal and breath sounds normal.  Abdominal: Soft. She exhibits no distension and no mass. There is tenderness. There is no rebound.       Generalized abdominal tenderness. No palpable masses no rebound but she pushes my hands away when palpating the abdomen due to pain.  Neurological: She is alert and oriented to person, place, and time. No cranial nerve deficit.  Skin: Skin is warm and dry. She is not diaphoretic.    ED Course  Procedures (including critical care time)  Labs Reviewed  POCT URINALYSIS DIP (DEVICE) - Abnormal; Notable for the following:    Hgb urine dipstick SMALL (*)     Protein, ur 100 (*)     Nitrite POSITIVE (*)     Leukocytes, UA SMALL (*)  Biochemical Testing Only. Please order routine urinalysis from main lab if confirmatory testing is needed.   All other components within normal limits  POCT PREGNANCY, URINE   No results found.   1. Cystitis, acute   2. Pyelonephritis, acute   3. Abdominal pain, acute, generalized       MDM   Results for orders placed during the hospital encounter of 09/03/12  POCT URINALYSIS DIP (DEVICE)  Component Value Range   Glucose, UA NEGATIVE  NEGATIVE mg/dL   Bilirubin Urine NEGATIVE  NEGATIVE   Ketones, ur NEGATIVE  NEGATIVE mg/dL   Specific Gravity, Urine 1.015  1.005 - 1.030   Hgb urine dipstick SMALL (*) NEGATIVE   pH 7.5  5.0 - 8.0   Protein, ur 100 (*) NEGATIVE mg/dL   Urobilinogen, UA 1.0  0.0 - 1.0 mg/dL   Nitrite POSITIVE (*) NEGATIVE   Leukocytes, UA SMALL (*) NEGATIVE  POCT PREGNANCY, URINE      Component Value Range   Preg Test, Ur NEGATIVE  NEGATIVE      Chest positive for urinary tract infection but I feel with her associated symptoms she also has pyelonephritis. I can't fully explain her generalized abdominal pain. This association with her profound malaise, low-grade fever and Moderate back pain deserves more of a workup as well as possible IV fluids and pain control. She is sent by shuttle to the emergency department.        Hayden Rasmussen, NP 09/03/12 1954

## 2012-09-03 NOTE — ED Notes (Signed)
Pt  Reports  Low  abd  Pain  With   Associated  Low  Back  Pain as  Well  X  1  Week   She  denys  Any  Vomiting  Diarrhea   She  denys  Any  Vaginal  Bleeding or  Discharge         She  denys  Any     Known  specefic injury

## 2012-09-04 ENCOUNTER — Emergency Department (HOSPITAL_COMMUNITY): Payer: Self-pay

## 2012-09-04 MED ORDER — ONDANSETRON HCL 4 MG PO TABS
4.0000 mg | ORAL_TABLET | Freq: Four times a day (QID) | ORAL | Status: DC
Start: 2012-09-04 — End: 2013-03-13

## 2012-09-04 MED ORDER — CEPHALEXIN 500 MG PO CAPS: 500.0000 mg | ORAL_CAPSULE | Freq: Four times a day (QID) | ORAL | Status: DC

## 2012-09-05 LAB — URINE CULTURE: Colony Count: >=100000

## 2012-09-05 NOTE — ED Provider Notes (Signed)
Medical screening examination/treatment/procedure(s) were performed by non-physician practitioner and as supervising physician I was immediately available for consultation/collaboration.    Vida Roller, MD 09/05/12 [phone]

## 2012-09-06 NOTE — ED Notes (Signed)
Positive urine culture- treated per protocol.

## 2013-03-13 ENCOUNTER — Emergency Department (HOSPITAL_COMMUNITY)
Admission: EM | Admit: 2013-03-13 | Discharge: 2013-03-13 | Disposition: A | Discharge status: Home / Self Care | Payer: Self-pay | Attending: Emergency Medicine | Admitting: Emergency Medicine

## 2013-03-13 ENCOUNTER — Encounter (HOSPITAL_COMMUNITY): Payer: Self-pay | Admitting: Physical Medicine and Rehabilitation

## 2013-03-13 DIAGNOSIS — F17200 Nicotine dependence, unspecified, uncomplicated: Secondary | ICD-10-CM | POA: Insufficient documentation

## 2013-03-13 DIAGNOSIS — L0291 Cutaneous abscess, unspecified: Secondary | ICD-10-CM

## 2013-03-13 DIAGNOSIS — N61 Mastitis without abscess: Secondary | ICD-10-CM | POA: Insufficient documentation

## 2013-03-13 MED ORDER — HYDROCODONE-ACETAMINOPHEN 5-325 MG PO TABS: 2.0000 | ORAL_TABLET | Freq: Four times a day (QID) | ORAL | Status: DC | PRN

## 2013-03-13 MED ORDER — SULFAMETHOXAZOLE-TRIMETHOPRIM 800-160 MG PO TABS: 2.0000 | ORAL_TABLET | Freq: Two times a day (BID) | ORAL | Status: DC

## 2013-03-13 NOTE — ED Provider Notes (Addendum)
History     CSN: [id number]  Arrival date & time 03/13/13  4098   First MD Initiated Contact with Patient 03/13/13 1039      Chief Complaint  Patient presents with  . Abscess    (Consider location/radiation/quality/duration/timing/severity/associated sxs/prior treatment) HPI Comments: Patient presenting with an abscess to the left breast.  Abscess has been present for the past week and is gradually worsening.  No treatment prior to arrival.  She has had abscesses in the past.  Patient is a 37 y.o. female presenting with abscess. The history is provided by the patient.  Abscess Abscess quality: fluctuance, painful and redness   Abscess quality: not draining   Red streaking: no   Progression:  Worsening Pain details:    Quality: tender. Context: not diabetes, not immunosuppression and not skin injury   Relieved by:  None tried Associated symptoms: no fever, no nausea and no vomiting   Risk factors: prior abscess     No past medical history on file.  Past Surgical History  Procedure Laterality Date  . Cholecystectomy    . Btl      No family history on file.  History  Substance Use Topics  . Smoking status: Current Some Day Smoker  . Smokeless tobacco: Not on file  . Alcohol Use: No    OB History   Grav Para Term Preterm Abortions TAB SAB Ect Mult Living                  Review of Systems  Constitutional: Negative for fever.  Gastrointestinal: Negative for nausea and vomiting.  Skin:       abscess  All other systems reviewed and are negative.    Allergies  Review of patient's allergies indicates no known allergies.  Home Medications  No current outpatient prescriptions on file.  BP 130/70  Pulse 74  Temp(Src) 98.2 F (36.8 C) (Oral)  Resp 20  SpO2 96%  Physical Exam  Nursing note and vitals reviewed. Constitutional: She appears well-developed and well-nourished. No distress.  HENT:  Head: Normocephalic and atraumatic.  Mouth/Throat:  Oropharynx is clear and moist.  Cardiovascular: Normal rate, regular rhythm and normal heart sounds.   Pulmonary/Chest: Effort normal and breath sounds normal. Left breast exhibits no inverted nipple, no mass and no nipple discharge.  Neurological: She is alert.  Skin: Skin is warm and dry. She is not diaphoretic.  Patient with a 2-2.5 cm fluctuant abscess of the underside of the left breast.  No drainage.  Mild surrounding erythema and induration.    Psychiatric: She has a normal mood and affect.    ED Course  Procedures (including critical care time)  Labs Reviewed - No data to display No results found.   No diagnosis found.  INCISION AND DRAINAGE Performed by: Anne Shutter, Soul Hackman Consent: Verbal consent obtained. Risks and benefits: risks, benefits and alternatives were discussed Type: abscess  Body area: left breast  Anesthesia: local infiltration  Incision was made with a scalpel.  Local anesthetic: lidocaine 2% with epinephrine  Anesthetic total: 6 ml  Complexity: complex Blunt dissection to break up loculations  Drainage: purulent  Drainage amount: moderate  Patient tolerance: Patient tolerated the procedure well with no immediate complications.    MDM  Patient with abscess of the underside of the left breast.  Patient offered surgery referral to have incision and drainage performed, but declined.  She reported that she just wanted it taken care of in the ED.  Incision and Drainage  performed with good results.  Patient with some surrounding erythema.  Started on Bactrim DS.  Patient instructed to follow up in 2 days to have the area rechecked.         Pascal Lux [place], PA-C 03/13/13 2247  Pascal Lux [place], PA-C 03/26/13 [phone]

## 2013-03-13 NOTE — ED Notes (Signed)
Pt presents to department for evaluation of red swollen raised area to L breast. Ongoing x1 week. Area is warm and painful to touch, 8/10 pain at the time. No drainage noted. Pt is alert and oriented x4.

## 2013-03-16 NOTE — ED Provider Notes (Signed)
Medical screening examination/treatment/procedure(s) were performed by non-physician practitioner and as supervising physician I was immediately available for consultation/collaboration.   Aryani Daffern Y. Kielee Care, MD 03/16/13 1221 

## 2013-03-28 NOTE — ED Provider Notes (Signed)
Medical screening examination/treatment/procedure(s) were performed by non-physician practitioner and as supervising physician I was immediately available for consultation/collaboration.    Farhiya Rosten Y. Trine Fread, MD 03/28/13 1543 

## 2014-06-30 ENCOUNTER — Encounter (HOSPITAL_COMMUNITY): Payer: Self-pay | Admitting: Emergency Medicine

## 2014-06-30 ENCOUNTER — Emergency Department (HOSPITAL_COMMUNITY)
Admission: EM | Admit: 2014-06-30 | Discharge: 2014-06-30 | Disposition: A | Discharge status: Home / Self Care | Payer: Self-pay | Attending: Emergency Medicine | Admitting: Emergency Medicine

## 2014-06-30 DIAGNOSIS — G43809 Other migraine, not intractable, without status migrainosus: Secondary | ICD-10-CM

## 2014-06-30 DIAGNOSIS — F17200 Nicotine dependence, unspecified, uncomplicated: Secondary | ICD-10-CM | POA: Insufficient documentation

## 2014-06-30 DIAGNOSIS — Z791 Long term (current) use of non-steroidal anti-inflammatories (NSAID): Secondary | ICD-10-CM | POA: Insufficient documentation

## 2014-06-30 DIAGNOSIS — R51 Headache: Secondary | ICD-10-CM | POA: Insufficient documentation

## 2014-06-30 DIAGNOSIS — G43019 Migraine without aura, intractable, without status migrainosus: Secondary | ICD-10-CM | POA: Insufficient documentation

## 2014-06-30 DIAGNOSIS — R197 Diarrhea, unspecified: Secondary | ICD-10-CM | POA: Insufficient documentation

## 2014-06-30 MED ORDER — PROMETHAZINE HCL 25 MG/ML IJ SOLN
12.5000 mg | Freq: Once | INTRAMUSCULAR | Status: AC
  Administered 2014-06-30: 12.5 mg via INTRAVENOUS
  Filled 2014-06-30 (×2): qty 1

## 2014-06-30 MED ORDER — PROCHLORPERAZINE MALEATE 10 MG PO TABS: 10.0000 mg | ORAL_TABLET | Freq: Two times a day (BID) | ORAL | Status: DC | PRN

## 2014-06-30 MED ORDER — SODIUM CHLORIDE 0.9 % IV BOLUS (SEPSIS)
1000.0000 mL | Freq: Once | INTRAVENOUS | Status: AC
  Administered 2014-06-30: 1000 mL via INTRAVENOUS

## 2014-06-30 MED ORDER — PROCHLORPERAZINE EDISYLATE 5 MG/ML IJ SOLN
10.0000 mg | Freq: Four times a day (QID) | INTRAMUSCULAR | Status: DC | PRN
  Administered 2014-06-30: 10 mg via INTRAVENOUS
  Filled 2014-06-30: qty 2

## 2014-06-30 NOTE — Discharge Instructions (Signed)

## 2014-06-30 NOTE — ED Provider Notes (Signed)
CSN: [id number]     Arrival date & time 06/30/14  1931 History   First MD Initiated Contact with Patient 06/30/14 1948     Chief Complaint  Patient presents with  . Migraine     (Consider location/radiation/quality/duration/timing/severity/associated sxs/prior Treatment) HPI  Patient presents to the emergency department with complaints of migraine headache that started a week and half ago. She is a history of headaches which typically resolve with over-the-counter advil. She says that she has tried taking this medication at home but has not helped. Last dose was at 1 PM today. The pain starts in the left side frontal and radiates back towards the base of her skull on the left side. She denies that the location and quality of the headache are not different than the normal headache. Her headache started when she was not doing anything strenuous at work. + photophobia and phonophobia. She has not had fevers, chills, nausea, vomiting, confusion, visual changes, weakness. + 1 episodes of diarrhea two days ago  History reviewed. No pertinent past medical history. Past Surgical History  Procedure Laterality Date  . Cholecystectomy    . Btl     History reviewed. No pertinent family history. History  Substance Use Topics  . Smoking status: Current Some Day Smoker  . Smokeless tobacco: Not on file  . Alcohol Use: No   OB History   Grav Para Term Preterm Abortions TAB SAB Ect Mult Living                 Review of Systems   Review of Systems  Gen: no weight loss, fevers, chills, night sweats  Eyes: no discharge or drainage, no occular pain or visual changes  Nose: no epistaxis or rhinorrhea  Mouth: no dental pain, no sore throat  Neck: no neck pain  Lungs:No wheezing or hemoptysis No coughing CV:  No palpitations, dependent edema or orthopnea. No chest pain Abd: +diarrhea. No nausea or vomiting, No abdominal pain  GU: no dysuria or gross hematuria  MSK:  No muscle weakness, No   pain Neuro: + headache, no focal neurologic deficits  Skin: no rash , no wounds Psyche: no complaints    Allergies  Review of patient's allergies indicates no known allergies.  Home Medications   Prior to Admission medications   Medication Sig Start Date End Date Taking? Authorizing Provider  naproxen sodium (ANAPROX) 220 MG tablet Take 220 mg by mouth 2 (two) times daily with a meal.   Yes Historical Provider, MD  prochlorperazine (COMPAZINE) 10 MG tablet Take 1 tablet (10 mg total) by mouth 2 (two) times daily as needed for nausea or vomiting. 06/30/14   Dorthula Matas, PA-C   BP 127/80  Pulse 76  Temp(Src) 98.9 F (37.2 C) (Oral)  Resp 20  Ht 5\' 7"  (1.702 m)  Wt 280 lb (127.007 kg)  BMI 43.84 kg/m2  SpO2 96%  LMP 06/03/2014 Physical Exam  Nursing note and vitals reviewed. Constitutional: She is oriented to person, place, and time. She appears well-developed and well-nourished. No distress.  HENT:  Head: Normocephalic and atraumatic.  Eyes: Pupils are equal, round, and reactive to light.  Neck: Normal range of motion. Neck supple. No spinous process tenderness and no muscular tenderness present. No Brudzinski's sign and no Kernig's sign noted.  Cardiovascular: Normal rate and regular rhythm.   Pulmonary/Chest: Effort normal.  Abdominal: Soft.  Neurological: She is alert and oriented to person, place, and time. She has normal strength. No cranial nerve  deficit or sensory deficit. GCS eye subscore is 4. GCS verbal subscore is 5. GCS motor subscore is 6.  Skin: Skin is warm and dry.      ED Course  Procedures (including critical care time) Labs Review Labs Reviewed - No data to display  Imaging Review No results found.   EKG Interpretation None      MDM   Final diagnoses:  Other migraine without status migrainosus, not intractable    Presentation is non concerning for [facility], ICH, Meningitis, or temporal arteritis. Pt is afebrile with no focal neuro  deficits, nuchal rigidity, or change in vision.  Medications  prochlorperazine (COMPAZINE) injection 10 mg (10 mg Intravenous Given 06/30/14 2045)  sodium chloride 0.9 % bolus 1,000 mL (1,000 mLs Intravenous New Bag/Given 06/30/14 2050)  promethazine (PHENERGAN) injection 12.5 mg (12.5 mg Intravenous Given 06/30/14 2045)    Patients pain relieved with medications (1 round) she reports feeling a bit dizzy and tired. Will give Rx for Compazine. Referral to neuro given.  38 y.o.Worthy FlankLatiesha D Delaughter's evaluation in the Emergency Department is complete. It has been determined that no acute conditions requiring further emergency intervention are present at this time. The patient/guardian have been advised of the diagnosis and plan. We have discussed signs and symptoms that warrant return to the ED, such as changes or worsening in symptoms.  Vital signs are stable at discharge. Filed Vitals:   06/30/14 1940  BP: 127/80  Pulse: 76  Temp: 98.9 F (37.2 C)  Resp: 20    Patient/guardian has voiced understanding and agreed to follow-up with the PCP or specialist.     Dorthula Matasiffany G Everitt Wenner, PA-C 06/30/14 2206

## 2014-06-30 NOTE — ED Notes (Signed)
Patient is alert and oriented x3.  She was given DC instructions and follow up visit instructions.  Patient gave verbal understanding. She was DC ambulatory under her own power to home.  V/S stable.  He was not showing any signs of distress on DC 

## 2014-06-30 NOTE — ED Notes (Signed)
Pt complains of a migraine for a few weeks, no OTC meds are working, she had diarrhea two days ago, none currently, dizzy and nauseated, no vomiting

## 2014-06-30 NOTE — ED Provider Notes (Signed)
Medical screening examination/treatment/procedure(s) were performed by non-physician practitioner and as supervising physician I was immediately available for consultation/collaboration.   EKG Interpretation None        Gilda Creasehristopher J. Pollina, MD 06/30/14 2210

## 2015-08-04 ENCOUNTER — Emergency Department (HOSPITAL_COMMUNITY)
Admission: EM | Admit: 2015-08-04 | Discharge: 2015-08-04 | Disposition: A | Discharge status: Home / Self Care | Payer: Self-pay | Attending: Emergency Medicine | Admitting: Emergency Medicine

## 2015-08-04 ENCOUNTER — Encounter (HOSPITAL_COMMUNITY): Payer: Self-pay | Admitting: Nurse Practitioner

## 2015-08-04 DIAGNOSIS — Z87891 Personal history of nicotine dependence: Secondary | ICD-10-CM | POA: Insufficient documentation

## 2015-08-04 DIAGNOSIS — M545 Low back pain, unspecified: Secondary | ICD-10-CM

## 2015-08-04 DIAGNOSIS — E669 Obesity, unspecified: Secondary | ICD-10-CM | POA: Insufficient documentation

## 2015-08-04 DIAGNOSIS — Z3202 Encounter for pregnancy test, result negative: Secondary | ICD-10-CM | POA: Insufficient documentation

## 2015-08-04 LAB — COMPREHENSIVE METABOLIC PANEL
ALBUMIN: 3.6 g/dL (ref 3.5–5.0)
ALK PHOS: 61 U/L (ref 38–126)
ALT: 15 U/L (ref 14–54)
ANION GAP: 6 (ref 5–15)
AST: 21 U/L (ref 15–41)
BUN: 10 mg/dL (ref 6–20)
CHLORIDE: 106 mmol/L (ref 101–111)
CO2: 26 mmol/L (ref 22–32)
Calcium: 9.2 mg/dL (ref 8.9–10.3)
Creatinine, Ser: 0.79 mg/dL (ref 0.44–1.00)
GFR calc non Af Amer: >60 mL/min (ref >60)
GLUCOSE: 87 mg/dL (ref 65–99)
POTASSIUM: 3.7 mmol/L (ref 3.5–5.1)
SODIUM: 138 mmol/L (ref 135–145)
Total Bilirubin: 0.5 mg/dL (ref 0.3–1.2)
Total Protein: 7 g/dL (ref 6.5–8.1)

## 2015-08-04 LAB — URINALYSIS, ROUTINE W REFLEX MICROSCOPIC
Bilirubin Urine: NEGATIVE
GLUCOSE, UA: NEGATIVE mg/dL
Ketones, ur: NEGATIVE mg/dL
LEUKOCYTES UA: NEGATIVE
Nitrite: NEGATIVE
PH: 6.5 (ref 5.0–8.0)
PROTEIN: NEGATIVE mg/dL
SPECIFIC GRAVITY, URINE: 1.024 (ref 1.005–1.030)
Urobilinogen, UA: 1 mg/dL (ref 0.0–1.0)

## 2015-08-04 LAB — CBC
HEMATOCRIT: 34.6 % — AB (ref 36.0–46.0)
HEMOGLOBIN: 11.3 g/dL — AB (ref 12.0–15.0)
MCH: 28.5 pg (ref 26.0–34.0)
MCHC: 32.7 g/dL (ref 30.0–36.0)
MCV: 87.4 fL (ref 78.0–100.0)
Platelets: 323 10*3/uL (ref 150–400)
RBC: 3.96 MIL/uL (ref 3.87–5.11)
RDW: 14.9 % (ref 11.5–15.5)
WBC: 7.9 10*3/uL (ref 4.0–10.5)

## 2015-08-04 LAB — POC URINE PREG, ED: Preg Test, Ur: NEGATIVE

## 2015-08-04 LAB — URINE MICROSCOPIC-ADD ON

## 2015-08-04 MED ORDER — METHOCARBAMOL 500 MG PO TABS: 500.0000 mg | ORAL_TABLET | Freq: Four times a day (QID) | ORAL | Status: DC | PRN

## 2015-08-04 MED ORDER — IBUPROFEN 800 MG PO TABS: 800.0000 mg | ORAL_TABLET | Freq: Three times a day (TID) | ORAL | Status: DC | PRN

## 2015-08-04 NOTE — ED Provider Notes (Signed)
CSN: [id number]     Arrival date & time 08/04/15  1417 History   First MD Initiated Contact with Patient 08/04/15 1522     Chief Complaint  Patient presents with  . Back Pain     (Consider location/radiation/quality/duration/timing/severity/associated sxs/prior Treatment) The history is provided by the patient.     Pt p/w low back pain that began 1 week ago.  The pain is located across her entire low back, worse on the left.  It was initially aching and intermittent, is now constant and sharp x 2 days.  Worse with movement and attempting to pick up objects, better with massage.  Has had increased urination.   Took 2 aleve PMs at 11am today with little relief.   Denies fevers, chills, abdominal pain, loss of control of bowel or bladder, weakness of numbness of the extremities, saddle anesthesia, bowel or vaginal complaints.  Denies dysuria, urinary urgency.  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  Her other job involves sitting for long periods.    History reviewed. No pertinent past medical history. Past Surgical History  Procedure Laterality Date  . Cholecystectomy    . Btl     History reviewed. No pertinent family history. Social History  Substance Use Topics  . Smoking status: Former Games developer  . Smokeless tobacco: None  . Alcohol Use: No   OB History    No data available     Review of Systems  All other systems reviewed and are negative.     Allergies  Review of patient's allergies indicates no known allergies.  Home Medications   Prior to Admission medications   Medication Sig Start Date End Date Taking? Authorizing Provider  naproxen sodium (ANAPROX) 220 MG tablet Take 220 mg by mouth 2 (two) times daily with a meal.   Yes Historical Provider, MD   BP 131/62 mmHg  Pulse 79  Temp(Src) 99.3 F (37.4 C) (Oral)  Resp 14  Ht 5\' 7"  (1.702 m)  Wt 276 lb 1 oz (125.221 kg)  BMI 43.23 kg/m2  SpO2 97%  LMP  08/03/2015 Physical Exam  Constitutional: She appears well-developed and well-nourished. No distress.  obese  HENT:  Head: Normocephalic and atraumatic.  Neck: Neck supple.  Cardiovascular: Normal rate and regular rhythm.   Pulmonary/Chest: Effort normal and breath sounds normal. No respiratory distress. She has no wheezes. She has no rales.  Abdominal: Soft. She exhibits no distension and no mass. There is tenderness in the suprapubic area. There is no rebound and no guarding.  Musculoskeletal:       Back:  Spine nontender, no crepitus, or stepoffs. Extremities:  Strength 5/5, sensation intact, distal pulses intact.     Neurological: She is alert.  Skin: She is not diaphoretic.  Nursing note and vitals reviewed.   ED Course  Procedures (including critical care time) Labs Review Labs Reviewed  CBC - Abnormal; Notable for the following:    Hemoglobin 11.3 (*)    HCT 34.6 (*)    All other components within normal limits  URINALYSIS, ROUTINE W REFLEX MICROSCOPIC (NOT AT [facility]) - Abnormal; Notable for the following:    Hgb urine dipstick MODERATE (*)    All other components within normal limits  COMPREHENSIVE METABOLIC PANEL  URINE MICROSCOPIC-ADD ON  POC URINE PREG, ED    Imaging Review No results found. I have personally reviewed and evaluated these images and lab results as part of  my medical decision-making.   EKG Interpretation None      MDM   Final diagnoses:  Bilateral low back pain without sciatica    Afebrile, nontoxic patient with mechanical low back pain. No red flags.  Hematuria likely from current menstrual period.  UA does not appear infected.  Doubt ureteral stone.  D/C home with motrin, robaxin, PCP resources given for follow up.  Discussed conservative measures for home care for muscular back pain.   Discussed result, findings, treatment, and follow up  with patient.  Pt given return precautions.  Pt verbalizes understanding and agrees with plan.       Trixie Dredge, PA-C 08/04/15 1836  Lyndal Pulley, MD 08/05/15 [phone]

## 2015-08-04 NOTE — ED Notes (Signed)
She c/o lower back and bilateral side pain constant over the past week. She has had nausea and constipation this week. She had small bowel movement yesterday.

## 2015-08-04 NOTE — Discharge Instructions (Signed)
Read the information below.  Use the prescribed medication as directed.  Please discuss all new medications with your pharmacist.  You may return to the Emergency Department at any time for worsening condition or any new symptoms that concern you.    If you develop fevers, loss of control of bowel or bladder, weakness or numbness in your legs, or are unable to walk, return to the ER for a recheck.  ° °Back Pain, Adult °Low back pain is very common. About 1 in 5 people have back pain. The cause of low back pain is rarely dangerous. The pain often gets better over time. About half of people with a sudden onset of back pain feel better in just 2 weeks. About 8 in 10 people feel better by 6 weeks.  °CAUSES °Some common causes of back pain include: °· Strain of the muscles or ligaments supporting the spine. °· Wear and tear (degeneration) of the spinal discs. °· Arthritis. °· Direct injury to the back. °DIAGNOSIS °Most of the time, the direct cause of low back pain is not known. However, back pain can be treated effectively even when the exact cause of the pain is unknown. Answering your caregiver's questions about your overall health and symptoms is one of the most accurate ways to make sure the cause of your pain is not dangerous. If your caregiver needs more information, he or she may order lab work or imaging tests (X-rays or MRIs). However, even if imaging tests show changes in your back, this usually does not require surgery. °HOME CARE INSTRUCTIONS °For many people, back pain returns. Since low back pain is rarely dangerous, it is often a condition that people can learn to manage on their own.  °· Remain active. It is stressful on the back to sit or stand in one place. Do not sit, drive, or stand in one place for more than 30 minutes at a time. Take short walks on level surfaces as soon as pain allows. Try to increase the length of time you walk each day. °· Do not stay in bed. Resting more than 1 or 2 days can  delay your recovery. °· Do not avoid exercise or work. Your body is made to move. It is not dangerous to be active, even though your back may hurt. Your back will likely heal faster if you return to being active before your pain is gone. °· Pay attention to your body when you  bend and lift. Many people have less discomfort when lifting if they bend their knees, keep the load close to their bodies, and avoid twisting. Often, the most comfortable positions are those that put less stress on your recovering back. °· Find a comfortable position to sleep. Use a firm mattress and lie on your side with your knees slightly bent. If you lie on your back, put a pillow under your knees. °· Only take over-the-counter or prescription medicines as directed by your caregiver. Over-the-counter medicines to reduce pain and inflammation are often the most helpful. Your caregiver may prescribe muscle relaxant drugs. These medicines help dull your pain so you can more quickly return to your normal activities and healthy exercise. °· Put ice on the injured area. °· Put ice in a plastic bag. °· Place a towel between your skin and the bag. °· Leave the ice on for 15-20 minutes, 03-04 times a day for the first 2 to 3 days. After that, ice and heat may be alternated to reduce pain and spasms. °· Ask your caregiver about trying back exercises and gentle massage. This may be of some   benefit.  Avoid feeling anxious or stressed.Stress increases muscle tension and can worsen back pain.It is important to recognize when you are anxious or stressed and learn ways to manage it.Exercise is a great option. SEEK MEDICAL CARE IF:  You have pain that is not relieved with rest or medicine.  You have pain that does not improve in 1 week.  You have new symptoms.  You are generally not feeling well. SEEK IMMEDIATE MEDICAL CARE IF:   You have pain that radiates from your back into your legs.  You develop new bowel or bladder control  problems.  You have unusual weakness or numbness in your arms or legs.  You develop nausea or vomiting.  You develop abdominal pain.  You feel faint. Document Released: 11/17/2005 Document Revised: 05/18/2012 Document Reviewed: 03/21/2014 [facility] Patient Information 2015 [place], [place]. This information is not intended to replace advice given to you by your health care provider. Make sure you discuss any questions you have with your health care provider.  Back Exercises Back exercises help treat and prevent back injuries. The goal of back exercises is to increase the strength of your abdominal and back muscles and the flexibility of your back. These exercises should be started when you no longer have back pain. Back exercises include:  Pelvic Tilt. Lie on your back with your knees bent. Tilt your pelvis until the lower part of your back is against the floor. Hold this position 5 to 10 sec and repeat 5 to 10 times.  Knee to Chest. Pull first 1 knee up against your chest and hold for 20 to 30 seconds, repeat this with the other knee, and then both knees. This may be done with the other leg straight or bent, whichever feels better.  Sit-Ups or Curl-Ups. Bend your knees 90 degrees. Start with tilting your pelvis, and do a partial, slow sit-up, lifting your trunk only 30 to 45 degrees off the floor. Take at least 2 to 3 seconds for each sit-up. Do not do sit-ups with your knees out straight. If partial sit-ups are difficult, simply do the above but with only tightening your abdominal muscles and holding it as directed.  Hip-Lift. Lie on your back with your knees flexed 90 degrees. Push down with your feet and shoulders as you raise your hips a couple inches off the floor; hold for 10 seconds, repeat 5 to 10 times.  Back arches. Lie on your stomach, propping yourself up on bent elbows. Slowly press on your hands, causing an arch in your low back. Repeat 3 to 5 times. Any initial stiffness and  discomfort should lessen with repetition over time.  Shoulder-Lifts. Lie face down with arms beside your body. Keep hips and torso pressed to floor as you slowly lift your head and shoulders off the floor. Do not overdo your exercises, especially in the beginning. Exercises may cause you some mild back discomfort which lasts for a few minutes; however, if the pain is more severe, or lasts for more than 15 minutes, do not continue exercises until you see your caregiver. Improvement with exercise therapy for back problems is slow.  See your caregivers for assistance with developing a proper back exercise program. Document Released: 12/25/2004 Document Revised: 02/09/2012 Document Reviewed: 09/18/2011 [facility] Patient Information 2015 [place], [place]. This information is not intended to replace advice given to you by your health care provider. Make sure you discuss any questions you have with your health care provider.  Back Injury Prevention Back injuries  can be extremely painful and difficult to heal. After having one back injury, you are much more likely to experience another later on. It is important to learn how to avoid injuring or re-injuring your back. The following tips can help you to prevent a back injury. PHYSICAL FITNESS  Exercise regularly and try to develop good tone in your abdominal muscles. Your abdominal muscles provide a lot of the support needed by your back.  Do aerobic exercises (walking, jogging, biking, swimming) regularly.  Do exercises that increase balance and strength (tai chi, yoga) regularly. This can decrease your risk of falling and injuring your back.  Stretch before and after exercising.  Maintain a healthy weight. The more you weigh, the more stress is placed on your back. For every pound of weight, 10 times that amount of pressure is placed on the back. DIET  Talk to your caregiver about how much calcium and vitamin D you need per day. These nutrients help to  prevent weakening of the bones (osteoporosis). Osteoporosis can cause broken (fractured) bones that lead to back pain.  Include good sources of calcium in your diet, such as dairy products, green, leafy vegetables, and products with calcium added (fortified).  Include good sources of vitamin D in your diet, such as milk and foods that are fortified with vitamin D.  Consider taking a nutritional supplement or a multivitamin if needed.  Stop smoking if you smoke. POSTURE  Sit and stand up straight. Avoid leaning forward when you sit or hunching over when you stand.  Choose chairs with good low back (lumbar) support.  If you work at a desk, sit close to your work so you do not need to lean over. Keep your chin tucked in. Keep your neck drawn back and elbows bent at a right angle. Your arms should look like the letter "L."  Sit high and close to the steering wheel when you drive. Add a lumbar support to your car seat if needed.  Avoid sitting or standing in one position for too long. Take breaks to get up, stretch, and walk around at least once every hour. Take breaks if you are driving for long periods of time.  Sleep on your side with your knees slightly bent, or sleep on your back with a pillow under your knees. Do not sleep on your stomach. LIFTING, TWISTING, AND REACHING  Avoid heavy lifting, especially repetitive lifting. If you must do heavy lifting:  Stretch before lifting.  Work slowly.  Rest between lifts.  Use carts and dollies to move objects when possible.  Make several small trips instead of carrying 1 heavy load.  Ask for help when you need it.  Ask for help when moving big, awkward objects.  Follow these steps when lifting:  Stand with your feet shoulder-width apart.  Get as close to the object as you can. Do not try to pick up heavy objects that are far from your body.  Use handles or lifting straps if they are available.  Bend at your knees. Squat down,  but keep your heels off the floor.  Keep your shoulders pulled back, your chin tucked in, and your back straight.  Lift the object slowly, tightening the muscles in your legs, abdomen, and buttocks. Keep the object as close to the center of your body as possible.  When you put a load down, use these same guidelines in reverse.  Do not:  Lift the object above your waist.  Twist at the waist  while lifting or carrying a load. Move your feet if you need to turn, not your waist.  Bend over without bending at your knees.  Avoid reaching over your head, across a table, or for an object on a high surface. OTHER TIPS  Avoid wet floors and keep sidewalks clear of ice to prevent falls.  Do not sleep on a mattress that is too soft or too hard.  Keep items that are used frequently within easy reach.  Put heavier objects on shelves at waist level and lighter objects on lower or higher shelves.  Find ways to decrease your stress, such as exercise, massage, or relaxation techniques. Stress can build up in your muscles. Tense muscles are more vulnerable to injury.  Seek treatment for depression or anxiety if needed. These conditions can increase your risk of developing back pain. SEEK MEDICAL CARE IF:  You injure your back.  You have questions about diet, exercise, or other ways to prevent back injuries. MAKE SURE YOU:  Understand these instructions.  Will watch your condition.  Will get help right away if you are not doing well or get worse. Document Released: 12/25/2004 Document Revised: 02/09/2012 Document Reviewed: 12/29/2011 [facility] Patient Information 2015 [place], [place]. This information is not intended to replace advice given to you by your health care provider. Make sure you discuss any questions you have with your health care provider.    Emergency Department Resource Guide 1) Find a Doctor and Pay Out of Pocket Although you won't have to find out who is covered by your  insurance plan, it is a good idea to ask around and get recommendations. You will then need to call the office and see if the doctor you have chosen will accept you as a new patient and what types of options they offer for patients who are self-pay. Some doctors offer discounts or will set up payment plans for their patients who do not have insurance, but you will need to ask so you aren't surprised when you get to your appointment.  2) Contact Your Local Health Department Not all health departments have doctors that can see patients for sick visits, but many do, so it is worth a call to see if yours does. If you don't know where your local health department is, you can check in your phone book. The CDC also has a tool to help you locate your state's health department, and many state websites also have listings of all of their local health departments.  3) Find a [facility] If your illness is not likely to be very severe or complicated, you may want to try a walk in clinic. These are popping up all over the country in pharmacies, drugstores, and shopping centers. They're usually staffed by nurse practitioners or physician assistants that have been trained to treat common illnesses and complaints. They're usually fairly quick and inexpensive. However, if you have serious medical issues or chronic medical problems, these are probably not your best option.  No Primary Care Doctor: - Call Health Connect at  [phone] - they can help you locate a primary care doctor that  accepts your insurance, provides certain services, etc. - Physician Referral Service- [phone]  Chronic Pain Problems: Organization         Address  Phone   Notes  [facility]  [phone] Patients need to be referred by their primary care doctor.   Medication Assistance: Organization  Address  Phone   Notes  [facility] Medication [facility] [place], [place] [address] [phone] --Must be a resident of [facility] -- Must have NO insurance coverage whatsoever (no Medicaid/ Medicare, etc.) -- The pt. MUST have a primary care doctor that directs their care regularly and follows them in the community   MedAssist  [phone]   [employer]  [phone]    Agencies that provide inexpensive medical care: Organization         Address  Phone   Notes  [place]  [phone]   [facility]    [phone]   [facility] [place], [place] [address] [phone]   [place] [address], [place] [phone]   Planned Parenthood    [phone]   [facility]    [phone]   [place] and [address], [place] Phone:  [phone], Fax:  [phone] Hours of Operation:  9 am - 6 pm, M-F.  Also accepts Medicaid/Medicare and self-pay.  [facility] for [place], [address], [place] Phone: [phone], Fax: [phone]. Hours of Operation:  8:30 am - 5:30 pm, M-F.  Also accepts Medicaid and self-pay.  [facility] [address], [place] Phone: [phone]   [place], [place], [place] [phone], Ext. [phone] Mondays & Thursdays: 7-9 AM.  First 15 patients are seen on a first come, first serve basis.    [place] Providers:  Organization         Address  Phone   Notes  [facility] [address], [place] [phone] Also accepts self-pay patients.  [facility] [address], [place]  [phone]   [place], [address], [place] [phone]   [facility] [address], [place] [phone]   Lucianne Lei [address], [place]   [phone] Only accepts [place] Access [place] patients after they have their name applied to their card.   Self-Pay (no insurance) in [facility]:  Organization         Address  Phone   Notes  Sickle Cell Patients, [facility] [place] [phone]   [facility] [place] [phone]   [facility] [place]  [address], [place],  [phone]   [facility]/Dr. Osei-Bonsu  [address], [place] or [address], [place] [phone] Phone number for both [place] and [place] locations is the same.  [facility] [address], [place] [phone]   [facility] [address] [address], [place] or [address] [phone]   [facility] [address] [place] [phone], phone; [phone], fax Sees patients 1st and 3rd Saturday of every month.  Must not qualify for public or private insurance (i.e. Medicaid, Medicare, [place] Health Choice, Veterans' Benefits)  Household income should be no more than 200% of the poverty level The clinic cannot treat you if you are pregnant or think you  are pregnant  Sexually transmitted diseases are not treated at the clinic.    Dental Care: Organization         Address  Phone  Notes  [facility] Department of [facility] [phone] Accepts children up to age 33 who are enrolled in [place] or [place]; pregnant women with a Medicaid card; and children who have applied for Medicaid or [place] Health Choice, but were declined, whose parents can pay a reduced fee at time of service.  [facility] Department of [facility]  [address], [place] [phone] Accepts children up to age 31 who are enrolled  in [place] or [place]; pregnant women with a Medicaid card; and children who have applied for Medicaid or [place] Health Choice, but were declined, whose parents can pay a reduced fee at time of service.  [place] Adult [facility] Access PROGRAM  [place] [phone] Patients are seen by appointment only. Walk-ins are not accepted. [place] will see patients 59 years of age and older. Monday - Tuesday (8am-5pm) Most Wednesdays (8:30-5pm) $30 per visit, cash only  [facility] Adult Dental Access PROGRAM  [address], [facility] [phone] Patients are seen by appointment only. Walk-ins are not accepted. [place] will see patients 58 years of age and older. One Wednesday Evening (Monthly: Volunteer Based).  $30 per visit, cash only  [place]  [phone] for adults; Children under age 41, call Graduate Pediatric Dentistry at [phone]. Children aged 35-14, please call [phone] to request a pediatric application.  Dental services are provided in all areas of dental care including fillings, crowns and bridges, complete and partial dentures, implants, gum treatment, root canals, and extractions. Preventive care is also provided. Treatment is provided to both adults and children. Patients are selected via a lottery and there is often a waiting list.   [facility] [address] [place]  [phone] [email or web address]   [facility] [address] [place], [place] [phone], Ext. [phone] Second and Fourth Thursday of each month, opens at 6:30 AM; Clinic ends at 9 AM.  Patients are seen on a first-come first-served basis, and a limited number are seen during each clinic.   [facility]  [address] Hillard Danker [place], [place] [phone]   Eligibility Requirements You must have lived in [place], [place], or [place] counties for at least the last three months.   You cannot be  eligible for state or federal sponsored [employer], including [employer], [place], or [employer].   You generally cannot be eligible for healthcare insurance through your employer.    How to apply: Eligibility screenings are held every Tuesday and Wednesday afternoon from 1:00 pm until 4:00 pm. You do not need an appointment for the interview!  [facility] [address], [place], [place]   [place]  [place] Department  [place]  [phone]    Behavioral Health Resources in the Community: Intensive Outpatient Programs Organization         Address  Phone  Notes  [place] Downing. [address], [place], [place] [phone]   [facility] [address], [place], [place]   ADS: Alcohol [facility] Drug Svcs [address], [place], [place]   [place] [address]  [place], [place] or [phone]   Substance Abuse Resources Organization         Address  Phone  Notes  Alcohol and Drug Services  5047896882   [place]  [phone]   [facility] [place]   Chinita Pester  [phone]   Residential & Outpatient Substance Abuse Program  [phone]   Psychological Services Organization         Address  Phone  Notes  [facility] [place]  [place]  [phone]   [place] [address] [place] or [phone]    Mobile Crisis Teams Organization         Address  Phone  Notes  [facility], Mobile Crisis Care Unit  [phone]   [facility]  [address] [place], [place]   [place] [address] [place] [phone]    Self-Help/Support Groups Organization          Address  Phone             Notes  [place]. of [place] - variety of support groups  [place] Call for more information  Narcotics Anonymous (NA), [facility] [address], [employer] [place]  2 meetings at this location   Special educational needs teacher         Address  Phone  Notes  [facility]idential [facility] [place],    [place]  [phone]   [facility]  [address], [place] [phone], [place], [place]   [place], [place] [phone] Admissions: 8am-3pm M-F  Incentives Substance [place] [address]    [place], [place] [phone]   [facility] [address] [place], [place], [place]   [facility] [address],  [place], [place]   [facility] - Intensive Outpatient [address], [place], [place]   [facility] ([place].,  [place], [place] [phone] or [phone]   [facility] (RTS) [address] [place], [place] Accepts Medicaid  [facility] [place] [address]  [place] 1-4434160338 Substance Abuse/Addiction Treatment   [facility] Organization         Address  Phone  Notes  [facility]  [phone]   Domenic Schwab, PhD [address] Arlis Porta [place], [place]   9134687307 or [phone]   [place] [place], [place] [phone]   [facility] [address] [place], [place] [phone] Insurance/Medicaid/sponsorship through [facility] and Families [address], [address]                                    [place], [place] [phone] [place] [address], [place] [phone]    Dr. Adele Schilder  [phone]   [facility] Dept. 1) [address], [place] 2) [place] 3)  [place] [address], [place] [phone]  [phone]   [place] (  [phone] [phone] or [phone] (After Hours)

## 2015-08-04 NOTE — ED Notes (Signed)
Pt had all belongings at time of d/c. 

## 2015-09-24 ENCOUNTER — Emergency Department (HOSPITAL_COMMUNITY): Payer: No Typology Code available for payment source

## 2015-09-24 ENCOUNTER — Encounter (HOSPITAL_COMMUNITY): Payer: Self-pay | Admitting: Emergency Medicine

## 2015-09-24 ENCOUNTER — Emergency Department (HOSPITAL_COMMUNITY)
Admission: EM | Admit: 2015-09-24 | Discharge: 2015-09-24 | Disposition: A | Discharge status: Home / Self Care | Payer: No Typology Code available for payment source | Attending: Emergency Medicine | Admitting: Emergency Medicine

## 2015-09-24 DIAGNOSIS — S0081XA Abrasion of other part of head, initial encounter: Secondary | ICD-10-CM | POA: Diagnosis not present

## 2015-09-24 DIAGNOSIS — R51 Headache: Secondary | ICD-10-CM

## 2015-09-24 DIAGNOSIS — S199XXA Unspecified injury of neck, initial encounter: Secondary | ICD-10-CM | POA: Diagnosis not present

## 2015-09-24 DIAGNOSIS — Y9389 Activity, other specified: Secondary | ICD-10-CM | POA: Insufficient documentation

## 2015-09-24 DIAGNOSIS — Y92410 Unspecified street and highway as the place of occurrence of the external cause: Secondary | ICD-10-CM | POA: Insufficient documentation

## 2015-09-24 DIAGNOSIS — S0990XA Unspecified injury of head, initial encounter: Secondary | ICD-10-CM | POA: Insufficient documentation

## 2015-09-24 DIAGNOSIS — R519 Headache, unspecified: Secondary | ICD-10-CM

## 2015-09-24 DIAGNOSIS — M542 Cervicalgia: Secondary | ICD-10-CM

## 2015-09-24 DIAGNOSIS — Y998 Other external cause status: Secondary | ICD-10-CM | POA: Insufficient documentation

## 2015-09-24 HISTORY — DX: Dorsalgia, unspecified: M54.9

## 2015-09-24 MED ORDER — METHOCARBAMOL 500 MG PO TABS: 500.0000 mg | ORAL_TABLET | Freq: Two times a day (BID) | ORAL | Status: DC

## 2015-09-24 MED ORDER — NAPROXEN 500 MG PO TABS: 500.0000 mg | ORAL_TABLET | Freq: Two times a day (BID) | ORAL | Status: DC

## 2015-09-24 MED ORDER — ACETAMINOPHEN 325 MG PO TABS
650.0000 mg | ORAL_TABLET | Freq: Once | ORAL | Status: AC
  Administered 2015-09-24: 650 mg via ORAL
  Filled 2015-09-24: qty 2
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
## 2015-09-24 NOTE — ED Notes (Signed)
Patient refusing discharge vitals.

## 2015-09-24 NOTE — ED Notes (Signed)
Misty StanleyLisa, [place] took the c-collar off of patient; patient being discharged home; visitors at bedside; wheelchair at door

## 2015-09-24 NOTE — ED Notes (Signed)
Patient transported to CT 

## 2015-09-24 NOTE — ED Notes (Signed)
Patient's bp cuff on the floor upon this RN entering the patient's room. Patient refusing to wear bp cuff at this time.

## 2015-09-24 NOTE — ED Notes (Signed)
Pt placed on monitor. Pt continues to be monitored by blood pressure and pulse ox. pts family remains at bedside.
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
## 2015-09-24 NOTE — ED Notes (Signed)
Patient asking for socks to place over her own personal socks; myself and her daughter put the non-slip socks on patient; visitor at bedside

## 2015-09-24 NOTE — ED Notes (Signed)
Patient's bp cuff on the floor upon this RN entering the room. Spoke with patient about the importance of monitoring her vitals and bp cuff placed back on patient

## 2015-09-24 NOTE — Discharge Instructions (Signed)
Take the prescribed medication as directed.  You may continue to have some soreness for the next few days which is normal, this should start improving over time. Follow-up with your primary care physician. Return to the ED for new or worsening symptoms.

## 2016-10-16 ENCOUNTER — Encounter: Payer: Self-pay | Admitting: Family Medicine

## 2016-10-16 ENCOUNTER — Ambulatory Visit (INDEPENDENT_AMBULATORY_CARE_PROVIDER_SITE_OTHER): Payer: BLUE CROSS/BLUE SHIELD | Admitting: Family Medicine

## 2016-10-16 VITALS — BP 120/80 | HR 78 | Wt 276.4 lb

## 2016-10-16 DIAGNOSIS — L732 Hidradenitis suppurativa: Secondary | ICD-10-CM | POA: Diagnosis not present

## 2016-10-16 DIAGNOSIS — Z7689 Persons encountering health services in other specified circumstances: Secondary | ICD-10-CM | POA: Diagnosis not present

## 2016-10-16 MED ORDER — CHLORHEXIDINE GLUCONATE 4 % EX LIQD: Freq: Every day | CUTANEOUS | 0 refills | Status: DC | PRN

## 2016-10-16 NOTE — Progress Notes (Signed)
   Subjective:    Patient ID: Christina Booth, female    DOB: May 04, 1976, 40 y.o.   MRN: [id number]  HPI Chief Complaint  Patient presents with  . lumps    lumps under breast and arms   She is new to the practice and here for an acute complaint. 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 States currently she is not having a bad outbreak. Reports having one small area that is draining in her left axilla. Reports she has flare up typically every 3-4 months but lately it is happening more frequently. States she uses dove bar soap. Stopped using deodorant approximately one year ago.  Denies history of diabetes. States she has a good immune system. Denies history of MRSA.  States she has never seen a dermatologist for this issue.  Sister has similar issue with recurrent abscesses.   Denies significant past medical history   States she goes to [facility] physicals. She is currently taking a weight loss medication because she would like to lose weight and she heard this could help with her recurrent sores.   Denies fever, chills, nausea, vomiting, diarrhea.   LMP: 2 weeks ago.   Reviewed allergies, medications, past medical,  family, and social history.    Review of Systems Pertinent positives and negatives in the history of present illness.     Objective:   Physical Exam  Constitutional: She appears well-developed and well-nourished. No distress.  Skin: Skin is warm and dry. No pallor.  0.5 cm open area with clear drainage to left axilla. No redness, warmth, induration or fluctuance. No surrounding erythema.   No areas with abscess or cellulitis.    BP 120/80   Pulse 78   Wt 276 lb 6.4 oz (125.4 kg)   BMI 43.29 kg/m       Assessment & Plan:  Hidradenitis suppurativa - Plan: chlorhexidine (HIBICLENS) 4 % external liquid, Ambulatory referral to Dermatology, WOUND CULTURE  Encounter to establish  care  Discussed that she does not appear to be systemically ill and there are no signs of a secondary bacterial infection. Plan to have her try hibiclens daily and refer her to dermatologist. She has never seen a dermatologist in the past.  Wound culture sent and will follow up pending.

## 2016-10-20 LAB — WOUND CULTURE
GRAM STAIN: NONE SEEN
Gram Stain: NONE SEEN
Gram Stain: NONE SEEN
Organism ID, Bacteria: NO GROWTH

## 2016-10-21 ENCOUNTER — Encounter: Payer: Self-pay | Admitting: Internal Medicine

## 2016-12-25 DIAGNOSIS — M170 Bilateral primary osteoarthritis of knee: Secondary | ICD-10-CM | POA: Diagnosis not present

## 2017-03-09 NOTE — Progress Notes (Deleted)
   Subjective:    Patient ID: Christina Booth, female    DOB: [birth year], 41 y.o.   MRN: [id number]  HPI No chief complaint on file.  She is here for a complete physical exam. Previous medical care: Last CPE:  Other providers:  Past medical history: Surgeries:  Family history: Mental Health History:  Social history: Lives with ***, works as ***, *** Smoking, drinking alcohol, drug use  Diet: *** Excerise: ***  Immunizations:  Health maintenance:  Mammogram: Colonoscopy: Last Gynecological Exam: Last Menstrual cycle: Pregnancies:  Last Dental Exam: Last Eye Exam:  Wears seatbelt always, uses sunscreen, smoke detectors in home and functioning, does not text while driving and feels safe in home environment.   Reviewed allergies, medications, past medical, surgical, family, and social history.     Review of Systems Review of Systems Constitutional: -fever, -chills, -sweats, -unexpected weight change,-fatigue ENT: -runny nose, -ear pain, -sore throat Cardiology:  -chest pain, -palpitations, -edema Respiratory: -cough, -shortness of breath, -wheezing Gastroenterology: -abdominal pain, -nausea, -vomiting, -diarrhea, -constipation  Hematology: -bleeding or bruising problems Musculoskeletal: -arthralgias, -myalgias, -joint swelling, -back pain Ophthalmology: -vision changes Urology: -dysuria, -difficulty urinating, -hematuria, -urinary frequency, -urgency Neurology: -headache, -weakness, -tingling, -numbness       Objective:   Physical Exam There were no vitals taken for this visit.  General Appearance:    Alert, cooperative, no distress, appears stated age  Head:    Normocephalic, without obvious abnormality, atraumatic  Eyes:    PERRL, conjunctiva/corneas clear, EOM's intact, fundi    benign  Ears:    Normal TM's and external ear canals  Nose:   Nares normal, mucosa normal, no drainage or sinus   tenderness  Throat:   Lips, mucosa, and tongue  normal; teeth and gums normal  Neck:   Supple, no lymphadenopathy;  thyroid:  no   enlargement/tenderness/nodules; no carotid   bruit or JVD  Back:    Spine nontender, no curvature, ROM normal, no CVA     tenderness  Lungs:     Clear to auscultation bilaterally without wheezes, rales or     ronchi; respirations unlabored  Chest Wall:    No tenderness or deformity   Heart:    Regular rate and rhythm, S1 and S2 normal, no murmur, rub   or gallop  Breast Exam:    No tenderness, masses, or nipple discharge or inversion.      No axillary lymphadenopathy  Abdomen:     Soft, non-tender, nondistended, normoactive bowel sounds,    no masses, no hepatosplenomegaly  Genitalia:    Normal external genitalia without lesions.  BUS and vagina normal; cervix without lesions, or cervical motion tenderness. No abnormal vaginal discharge.  Uterus and adnexa not enlarged, nontender, no masses.  Pap performed  Rectal:    Not performed due to age<40 and no related complaints  Extremities:   No clubbing, cyanosis or edema  Pulses:   2+ and symmetric all extremities  Skin:   Skin color, texture, turgor normal, no rashes or lesions  Lymph nodes:   Cervical, supraclavicular, and axillary nodes normal  Neurologic:   CNII-XII intact, normal strength, sensation and gait; reflexes 2+ and symmetric throughout          Psych:   Normal mood, affect, hygiene and grooming.    Urinalysis dipstick:       Assessment & Plan:  Routine general medical examination at a health care facility

## 2017-03-10 ENCOUNTER — Encounter: Payer: BLUE CROSS/BLUE SHIELD | Admitting: Family Medicine

## 2017-03-16 ENCOUNTER — Telehealth: Payer: Self-pay | Admitting: Internal Medicine

## 2017-03-16 NOTE — Telephone Encounter (Signed)

## 2017-03-16 NOTE — Telephone Encounter (Signed)
E 

## 2017-03-18 ENCOUNTER — Encounter: Payer: Self-pay | Admitting: Family Medicine

## 2017-03-18 NOTE — Telephone Encounter (Signed)
No show letter with fee accessed sent 03/18/2017

## 2017-04-07 DIAGNOSIS — M170 Bilateral primary osteoarthritis of knee: Secondary | ICD-10-CM | POA: Diagnosis not present

## 2017-08-12 DIAGNOSIS — M2550 Pain in unspecified joint: Secondary | ICD-10-CM | POA: Diagnosis not present

## 2017-08-12 DIAGNOSIS — G4700 Insomnia, unspecified: Secondary | ICD-10-CM | POA: Diagnosis not present

## 2017-08-12 DIAGNOSIS — R5383 Other fatigue: Secondary | ICD-10-CM | POA: Diagnosis not present

## 2017-08-12 DIAGNOSIS — E782 Mixed hyperlipidemia: Secondary | ICD-10-CM | POA: Diagnosis not present

## 2018-04-07 ENCOUNTER — Emergency Department (HOSPITAL_COMMUNITY)
Admission: EM | Admit: 2018-04-07 | Discharge: 2018-04-07 | Disposition: A | Discharge status: Home / Self Care | Payer: BLUE CROSS/BLUE SHIELD | Attending: Emergency Medicine | Admitting: Emergency Medicine

## 2018-04-07 ENCOUNTER — Emergency Department (HOSPITAL_COMMUNITY): Payer: BLUE CROSS/BLUE SHIELD

## 2018-04-07 ENCOUNTER — Encounter (HOSPITAL_COMMUNITY): Payer: Self-pay

## 2018-04-07 DIAGNOSIS — D649 Anemia, unspecified: Secondary | ICD-10-CM | POA: Diagnosis not present

## 2018-04-07 DIAGNOSIS — R42 Dizziness and giddiness: Secondary | ICD-10-CM | POA: Diagnosis not present

## 2018-04-07 DIAGNOSIS — R0789 Other chest pain: Secondary | ICD-10-CM

## 2018-04-07 DIAGNOSIS — R079 Chest pain, unspecified: Secondary | ICD-10-CM | POA: Diagnosis not present

## 2018-04-07 DIAGNOSIS — Z79899 Other long term (current) drug therapy: Secondary | ICD-10-CM | POA: Diagnosis not present

## 2018-04-07 LAB — BASIC METABOLIC PANEL
ANION GAP: 7 (ref 5–15)
BUN: 11 mg/dL (ref 6–20)
CHLORIDE: 104 mmol/L (ref 101–111)
CO2: 28 mmol/L (ref 22–32)
Calcium: 9.1 mg/dL (ref 8.9–10.3)
Creatinine, Ser: 0.82 mg/dL (ref 0.44–1.00)
GFR calc non Af Amer: >60 mL/min (ref >60)
Glucose, Bld: 97 mg/dL (ref 65–99)
POTASSIUM: 3.9 mmol/L (ref 3.5–5.1)
Sodium: 139 mmol/L (ref 135–145)

## 2018-04-07 LAB — I-STAT BETA HCG BLOOD, ED (MC, WL, AP ONLY): I-stat hCG, quantitative: <5 m[IU]/mL (ref <5)

## 2018-04-07 LAB — CBC
HEMATOCRIT: 32.5 % — AB (ref 36.0–46.0)
HEMOGLOBIN: 10.3 g/dL — AB (ref 12.0–15.0)
MCH: 26.9 pg (ref 26.0–34.0)
MCHC: 31.7 g/dL (ref 30.0–36.0)
MCV: 84.9 fL (ref 78.0–100.0)
Platelets: 358 10*3/uL (ref 150–400)
RBC: 3.83 MIL/uL — AB (ref 3.87–5.11)
RDW: 15.3 % (ref 11.5–15.5)
WBC: 8.4 10*3/uL (ref 4.0–10.5)

## 2018-04-07 LAB — I-STAT TROPONIN, ED
Troponin i, poc: 0 ng/mL (ref 0.00–0.08)
Troponin i, poc: 0 ng/mL (ref 0.00–0.08)

## 2018-04-07 LAB — CBG MONITORING, ED: GLUCOSE-CAPILLARY: 95 mg/dL (ref 65–99)

## 2018-04-07 MED ORDER — PREDNISONE 20 MG PO TABS
60.0000 mg | ORAL_TABLET | ORAL | Status: AC
  Administered 2018-04-07: 60 mg via ORAL
  Filled 2018-04-07: qty 3

## 2018-04-07 MED ORDER — SODIUM CHLORIDE 0.9 % IV BOLUS
1000.0000 mL | Freq: Once | INTRAVENOUS | Status: AC
  Administered 2018-04-07: 1000 mL via INTRAVENOUS

## 2018-04-07 MED ORDER — FUROSEMIDE 20 MG PO TABS: 80.0000 mg | ORAL_TABLET | Freq: Once | ORAL | Status: DC

## 2018-04-07 MED ORDER — KETOROLAC TROMETHAMINE 30 MG/ML IJ SOLN
15.0000 mg | Freq: Once | INTRAMUSCULAR | Status: AC
  Administered 2018-04-07: 15 mg via INTRAVENOUS
  Filled 2018-04-07: qty 1

## 2018-04-07 MED ORDER — PREDNISONE 20 MG PO TABS: 40.0000 mg | ORAL_TABLET | Freq: Every day | ORAL | 0 refills | Status: DC

## 2018-04-07 NOTE — ED Triage Notes (Signed)
Pt states that she has had CP on and off for the past week, worse tonight with dizziness and nausea, radiation to neck.

## 2018-04-07 NOTE — Discharge Instructions (Addendum)
As discussed, your evaluation today has been largely reassuring.  But, it is important that you monitor your condition carefully, and do not hesitate to return to the ED if you develop new, or concerning changes in your condition.  Otherwise, be sure to schedule follow-up with both your primary care physician and our gastroenterology colleagues.

## 2018-04-07 NOTE — ED Provider Notes (Signed)
[facility] EMERGENCY DEPARTMENT Provider Note   CSN: [id number] Arrival date & time: 04/07/18  0501     History   Chief Complaint Chief Complaint  Patient presents with  . Chest Pain    HPI Christina Booth is a 42 y.o. female.  HPI Patient presents with chest pain and dizziness. She is here with her husband and daughter who assist with the HPI. Chest pain began about 1 week ago, without clear precipitant. Since onset pain is been mild, but over the past 2 days in particular has become more severe, persistent. Pain is across the upper chest bilaterally, described as sore, worse with activity. No new dyspnea, no lightheadedness, no syncope, no fever, no cough, no chills.  When patient is a non-smoker. Patient also complains of dizziness, new over about the same timeframe, described as unsteadiness. Patient states that she is generally well, though she acknowledges occasional blood in her stool over at least the past year. Patient also works 2 full-time jobs, first and third shift. She does not drink alcohol.  Past Medical History:  Diagnosis Date  . Back pain     There are no active problems to display for this patient.   Past Surgical History:  Procedure Laterality Date  . btl    . CHOLECYSTECTOMY       OB History   None      Home Medications    Prior to Admission medications   Medication Sig Start Date End Date Taking? Authorizing Provider  chlorhexidine (HIBICLENS) 4 % external liquid Apply topically daily as needed. 10/16/16   Henson, Vickie L, NP-C  meloxicam (MOBIC) 15 MG tablet Take 15 mg by mouth daily. 09/24/16   [provider]  phentermine (ADIPEX-P) 37.5 MG tablet Take 37.5 mg by mouth daily. 09/24/16   [provider]  topiramate (TOPAMAX) 50 MG tablet Take 50 mg by mouth 2 (two) times daily. 09/24/16   [provider]    Family History No family history on file.  Social History Social  History   Tobacco Use  . Smoking status: Never Smoker  . Smokeless tobacco: Never Used  Substance Use Topics  . Alcohol use: No  . Drug use: No     Allergies   Patient has no known allergies.   Review of Systems Review of Systems  Constitutional:       Per HPI, otherwise negative  HENT:       Per HPI, otherwise negative  Respiratory:       Per HPI, otherwise negative  Cardiovascular:       Per HPI, otherwise negative  Gastrointestinal: Negative for vomiting.  Endocrine:       Negative aside from HPI  Genitourinary:       Neg aside from HPI   Musculoskeletal:       Per HPI, otherwise negative  Skin: Negative.   Neurological: Positive for dizziness. Negative for seizures, syncope, speech difficulty and weakness.     Physical Exam Updated Vital Signs BP 132/85   Pulse 86   Temp 98.4 F (36.9 C) (Oral)   Resp (!) 21   LMP 04/05/2018   SpO2 100%   Physical Exam  Constitutional: She is oriented to person, place, and time. She appears well-developed and well-nourished. No distress.  HENT:  Head: Normocephalic and atraumatic.  Eyes: Conjunctivae and EOM are normal.  Cardiovascular: Normal rate and regular rhythm.  Pulmonary/Chest: Effort normal and breath sounds normal. No stridor. No respiratory  distress.  Abdominal: She exhibits no distension.    Musculoskeletal: She exhibits no edema.  Neurological: She is alert and oriented to person, place, and time. No cranial nerve deficit.  Skin: Skin is warm and dry.  Psychiatric: She has a normal mood and affect.  Nursing note and vitals reviewed.    ED Treatments / Results  Labs (all labs ordered are listed, but only abnormal results are displayed) Labs Reviewed  CBC - Abnormal; Notable for the following components:      Result Value   RBC 3.83 (*)    Hemoglobin 10.3 (*)    HCT 32.5 (*)    All other components within normal limits  BASIC METABOLIC PANEL  CBG MONITORING, ED  I-STAT TROPONIN, ED  I-STAT  BETA HCG BLOOD, ED ([facility], [facility], [facility] ONLY)  I-STAT TROPONIN, ED    EKG EKG Interpretation  Date/Time:  Wednesday Apr 07 2018 05:11:18 EDT Ventricular Rate:  69 PR Interval:  194 QRS Duration: 88 QT Interval:  404 QTC Calculation: 432 R Axis:   -2 Text Interpretation:  Normal sinus rhythm Normal ECG No significant change since last tracing Confirmed by Raeford Razor [phone]) on 04/07/2018 7:10:40 AM   Radiology Dg Chest 2 View  Result Date: 04/07/2018 CLINICAL DATA:  42 year old female with chest pain. EXAM: CHEST - 2 VIEW COMPARISON:  Chest radiograph dated 07/31/2011 FINDINGS: The heart size and mediastinal contours are within normal limits. Both lungs are clear. The visualized skeletal structures are unremarkable. IMPRESSION: No active cardiopulmonary disease. Electronically Signed   By: Elgie Collard M.D.   On: 04/07/2018 06:40    Procedures Procedures (including critical care time)  Medications Ordered in ED Medications  sodium chloride 0.9 % bolus 1,000 mL (1,000 mLs Intravenous New Bag/Given 04/07/18 1348)  ketorolac (TORADOL) 30 MG/ML injection 15 mg (15 mg Intravenous Given 04/07/18 1346)     Initial Impression / Assessment and Plan / ED Course  I have reviewed the triage vital signs and the nursing notes.  Pertinent labs & imaging results that were available during my care of the patient were reviewed by me and considered in my medical decision making (see chart for details).    Initial labs generally reassuring, no renal dysfunction, but there is evidence for anemia, with hemoglobin 10.3 On repeat exam the patient is in similar condition, awake, alert, he dynamically unremarkable.   3:33 PM Patient has been ambulatory, notes that her dizziness has improved. We had a lengthy conversation about her anemia, and family history of sister with colon cancer. Given patient's description of intermittent bleeding with stool, she will follow-up with gastroenterology. With no  evidence for ischemic changes, an EKG, 2 normal troponin, reproducible chest pain, low suspicion for coronary blockage. Patient will start short course of steroids, avoiding NSAIDs given her possible GI bleed. Patient appropriate for close outpatient follow-up with primary care and gastroenterology.  Final Clinical Impressions(s) / ED Diagnoses   Final diagnoses:  Atypical chest pain  Anemia, unspecified type    ED Discharge Orders        Ordered    predniSONE (DELTASONE) 20 MG tablet  Daily with breakfast     04/07/18 1531       Gerhard Munch, MD 04/07/18 1534

## 2018-04-07 NOTE — ED Notes (Signed)
PT states understanding of care given, follow up care, and medication prescribed. PT ambulated from ED to car with a steady gait. 

## 2018-04-09 ENCOUNTER — Inpatient Hospital Stay: Payer: BLUE CROSS/BLUE SHIELD | Admitting: Family Medicine

## 2018-04-09 DIAGNOSIS — K5900 Constipation, unspecified: Secondary | ICD-10-CM | POA: Diagnosis not present

## 2018-04-09 DIAGNOSIS — D649 Anemia, unspecified: Secondary | ICD-10-CM | POA: Diagnosis not present

## 2018-04-09 DIAGNOSIS — R0789 Other chest pain: Secondary | ICD-10-CM | POA: Diagnosis not present

## 2018-04-09 DIAGNOSIS — Z800 Family history of malignant neoplasm of digestive organs: Secondary | ICD-10-CM | POA: Diagnosis not present

## 2018-04-12 ENCOUNTER — Encounter: Payer: Self-pay | Admitting: Family Medicine

## 2018-04-12 ENCOUNTER — Ambulatory Visit: Payer: BLUE CROSS/BLUE SHIELD | Admitting: Family Medicine

## 2018-04-12 VITALS — BP 130/80 | HR 94 | Resp 16 | Ht 67.0 in | Wt 280.8 lb

## 2018-04-12 DIAGNOSIS — R0789 Other chest pain: Secondary | ICD-10-CM

## 2018-04-12 NOTE — Progress Notes (Signed)
   Subjective:    Patient ID: Christina Booth, female    DOB: Mar 09, 1976, 42 y.o.   MRN: [id number]  HPI Chief Complaint  Patient presents with  . ER follow-up    Pulled chest muscle at work but not workers comp   She is here to follow up on atypical chest pain evaluated in the ED on 04/07/2018. Negative cardiac work up.  Chest pain is persistent and worse with movements such as lifting her arms above her head. Pain is improved with rest.   She stopped NSAIDs as recommended due to possible GI bleed from taking daily Aleve. She was prescribed oral steroids for chest pain believed to be MSK in the ED. States she has not been taking the steroids due to concerns about weight gain.  Has not been taking anything for the pain.   States she saw [facility] last Friday. She had repeat labs and was told her blood work had improved. She is taking oral iron for IDA. Will have an EGD and colonoscopy in 2 days.   No new concerns or complaints.   Denies fever, chills, dizziness, palpitations, shortness of breath, cough, abdominal pain, vomiting or diarrhea.   Reviewed allergies, medications, past medical, surgical, family, and social history.   Review of Systems Pertinent positives and negatives in the history of present illness.     Objective:   Physical Exam  Constitutional: She is oriented to person, place, and time. She appears well-developed and well-nourished. No distress.  HENT:  Mouth/Throat: Oropharynx is clear and moist.  Eyes: Pupils are equal, round, and reactive to light. Conjunctivae are normal.  Neck: Normal range of motion. Neck supple.  Cardiovascular: Normal rate, regular rhythm, normal heart sounds and intact distal pulses. Exam reveals no gallop and no friction rub.  No murmur heard. Pulmonary/Chest: Effort normal and breath sounds normal. She exhibits tenderness. She exhibits no crepitus.    Lymphadenopathy:    She has no cervical adenopathy.  Neurological: She is  alert and oriented to person, place, and time. She has normal strength. No cranial nerve deficit or sensory deficit.  Skin: Skin is warm and dry. Capillary refill takes less than 2 seconds. No bruising and no rash noted. No pallor.  Psychiatric: She has a normal mood and affect. Her speech is normal and behavior is normal.   BP 130/80   Pulse 94   Resp 16   Ht  (1.702 m)   Wt 280 lb 12.8 oz (127.4 kg)   LMP 04/05/2018   SpO2 95%   BMI 43.98 kg/m       Assessment & Plan:  Chest pain, musculoskeletal  Reviewed ED notes and results. Chest pain appears to be related to a MSK etiology. She has not yet tried the steroid dose pak prescribed by the ED and was advised to avoid NSAIDs due to possible GI bleed which is being worked up by [employer].  Discussed options of taking the steroids or holding off until GI appointment this week and see if NSAIDs are recommended at this point.  Follow up as needed.

## 2018-04-14 ENCOUNTER — Telehealth: Payer: Self-pay | Admitting: Family Medicine

## 2018-04-14 DIAGNOSIS — K2990 Gastroduodenitis, unspecified, without bleeding: Secondary | ICD-10-CM | POA: Diagnosis not present

## 2018-04-14 DIAGNOSIS — D509 Iron deficiency anemia, unspecified: Secondary | ICD-10-CM | POA: Diagnosis not present

## 2018-04-14 HISTORY — PX: UPPER GI ENDOSCOPY: SHX6162

## 2018-04-14 LAB — HM COLONOSCOPY

## 2018-04-14 NOTE — Telephone Encounter (Signed)
She may need an appointment to discuss this but I am ok with referring her. Have her schedule with me unless she does not need a referral.

## 2018-04-14 NOTE — Telephone Encounter (Signed)
Pt called and is wanting  A referral to the Bariatric  clinic states one is at the hospital and in [place], states she was suppose to wait after her colonoscopy results and she go them and they where negative she can be reached at [phone]

## 2018-04-15 ENCOUNTER — Telehealth: Payer: Self-pay

## 2018-04-15 NOTE — Telephone Encounter (Signed)
Noted. Will do referral once seen. Will find out the exact place she wants the referral sent

## 2018-04-15 NOTE — Telephone Encounter (Signed)
Patient called back for referral and I scheduled her an appointment for 04-16-18.

## 2018-04-15 NOTE — Telephone Encounter (Signed)
Left message for pt to call back. She needs an appt to have a referral

## 2018-04-16 ENCOUNTER — Encounter: Payer: Self-pay | Admitting: Family Medicine

## 2018-04-16 ENCOUNTER — Ambulatory Visit: Payer: BLUE CROSS/BLUE SHIELD | Admitting: Family Medicine

## 2018-04-16 VITALS — BP 120/80 | HR 95 | Ht 67.0 in | Wt 279.6 lb

## 2018-04-16 DIAGNOSIS — Z713 Dietary counseling and surveillance: Secondary | ICD-10-CM

## 2018-04-16 DIAGNOSIS — Z6841 Body Mass Index (BMI) 40.0 and over, adult: Secondary | ICD-10-CM

## 2018-04-16 NOTE — Progress Notes (Signed)
   Subjective:    Patient ID: Christina Booth, female    DOB: June 17, 1976, 42 y.o.   MRN: [id number]  HPI Chief Complaint  Patient presents with  . referral to weight loss    referral to [facility]   She is here requesting a referral to the [facility]. Reports that she has struggled with being over weight since having her 3rd child in 2003. Reports having tried multiple diets including WW, Keto and several other ones.  States she has also tried weight loss medications in the past.  Most recently stopped taking phentermine and Topamax approximately 1 month ago.  States she has been losing and gaining weight over the past 16 years and cannot consistently keep weight down.  States she is also seen a nutritionist in the past.  States she is very concerned about the potential consequences of her obesity.  Reports that she is starting to have chronic knee pain.  She has been a patient at [facility] and [facility].  These records are not with her today but we will attempt to get them.  Denies fever, chills, dizziness, chest pain, palpitations, shortness of breath, abdominal pain, N/V/D, urinary symptoms, LE edema.   Reviewed allergies, medications, past medical, surgical, family, and social history.    Review of Systems Pertinent positives and negatives in the history of present illness.     Objective:   Physical Exam BP 120/80   Pulse 95   Ht  (1.702 m)   Wt 279 lb 9.6 oz (126.8 kg)   LMP 04/05/2018   SpO2 98%   BMI 43.79 kg/m   Alert and in no distress. Pharyngeal area is normal. Neck is supple without adenopathy or thyromegaly. Cardiac exam shows a regular sinus rhythm without murmurs or gallops. Lungs are clear to auscultation.      Assessment & Plan:  Class 3 severe obesity with body mass index (BMI) of 40.0 to 44.9 in adult, unspecified obesity type, unspecified whether serious comorbidity present (HCC)  Encounter for  weight loss counseling  She is aware and verbalizes her concern regarding potential health risks due to obesity and is ready to move forward with weight loss counseling including possibility of bariatric surgery. It appears that she has tried multiple diets and medications over the past 16 years without successfully keeping weight off. She reports having blood work done at [facility] and most recently [facility].  She would like for [place] to get these records.  We will then decide if we need to check additional labs.  I am referring her to the [facility] bariatric clinic and will assist her in this process as she wishes.

## 2018-04-20 DIAGNOSIS — K2990 Gastroduodenitis, unspecified, without bleeding: Secondary | ICD-10-CM | POA: Diagnosis not present

## 2018-05-03 ENCOUNTER — Encounter: Payer: Self-pay | Admitting: Internal Medicine

## 2018-05-07 ENCOUNTER — Encounter: Payer: Self-pay | Admitting: Family Medicine

## 2018-05-31 ENCOUNTER — Telehealth: Payer: Self-pay | Admitting: Family Medicine

## 2018-05-31 NOTE — Telephone Encounter (Signed)
Received requested records form [facility]. Sending back for review.  °

## 2018-06-07 ENCOUNTER — Encounter: Payer: Self-pay | Admitting: Family Medicine

## 2018-06-08 ENCOUNTER — Encounter: Payer: Self-pay | Admitting: Family Medicine

## 2018-06-16 ENCOUNTER — Telehealth: Payer: Self-pay | Admitting: Family Medicine

## 2018-06-16 NOTE — Telephone Encounter (Signed)
Received requested records from [facility]. These include notes and labs. Sending back for review.

## 2018-06-17 ENCOUNTER — Emergency Department (HOSPITAL_COMMUNITY)
Admission: EM | Admit: 2018-06-17 | Discharge: 2018-06-18 | Disposition: A | Discharge status: Home / Self Care | Payer: BLUE CROSS/BLUE SHIELD | Attending: Emergency Medicine | Admitting: Emergency Medicine

## 2018-06-17 ENCOUNTER — Encounter (HOSPITAL_COMMUNITY): Payer: Self-pay | Admitting: Emergency Medicine

## 2018-06-17 ENCOUNTER — Other Ambulatory Visit: Payer: Self-pay

## 2018-06-17 DIAGNOSIS — Z5321 Procedure and treatment not carried out due to patient leaving prior to being seen by health care provider: Secondary | ICD-10-CM | POA: Insufficient documentation

## 2018-06-17 DIAGNOSIS — R109 Unspecified abdominal pain: Secondary | ICD-10-CM | POA: Diagnosis not present

## 2018-06-17 LAB — I-STAT BETA HCG BLOOD, ED (MC, WL, AP ONLY): I-stat hCG, quantitative: <5 m[IU]/mL (ref <5)

## 2018-06-17 NOTE — ED Triage Notes (Signed)
Pt c/o back pain that radiates to her lower abdomen x 1 day. Denies urinary symptoms.

## 2018-06-18 LAB — COMPREHENSIVE METABOLIC PANEL
ALK PHOS: 52 U/L (ref 38–126)
ALT: 11 U/L (ref 0–44)
AST: 18 U/L (ref 15–41)
Albumin: 3.4 g/dL — ABNORMAL LOW (ref 3.5–5.0)
Anion gap: 8 (ref 5–15)
BUN: 12 mg/dL (ref 6–20)
CALCIUM: 8.9 mg/dL (ref 8.9–10.3)
CO2: 25 mmol/L (ref 22–32)
CREATININE: 0.93 mg/dL (ref 0.44–1.00)
Chloride: 106 mmol/L (ref 98–111)
GFR calc non Af Amer: >60 mL/min (ref >60)
Glucose, Bld: 103 mg/dL — ABNORMAL HIGH (ref 70–99)
Potassium: 3.8 mmol/L (ref 3.5–5.1)
Sodium: 139 mmol/L (ref 135–145)
Total Bilirubin: 0.2 mg/dL — ABNORMAL LOW (ref 0.3–1.2)
Total Protein: 6.6 g/dL (ref 6.5–8.1)

## 2018-06-18 LAB — URINALYSIS, ROUTINE W REFLEX MICROSCOPIC
Bacteria, UA: NONE SEEN
Bilirubin Urine: NEGATIVE
GLUCOSE, UA: NEGATIVE mg/dL
KETONES UR: NEGATIVE mg/dL
LEUKOCYTES UA: NEGATIVE
Nitrite: NEGATIVE
PH: 7 (ref 5.0–8.0)
Protein, ur: NEGATIVE mg/dL
SPECIFIC GRAVITY, URINE: 1.021 (ref 1.005–1.030)

## 2018-06-18 LAB — CBC
HCT: 33.3 % — ABNORMAL LOW (ref 36.0–46.0)
Hemoglobin: 10.6 g/dL — ABNORMAL LOW (ref 12.0–15.0)
MCH: 27.3 pg (ref 26.0–34.0)
MCHC: 31.8 g/dL (ref 30.0–36.0)
MCV: 85.8 fL (ref 78.0–100.0)
PLATELETS: 339 10*3/uL (ref 150–400)
RBC: 3.88 MIL/uL (ref 3.87–5.11)
RDW: 15.9 % — AB (ref 11.5–15.5)
WBC: 8.5 10*3/uL (ref 4.0–10.5)

## 2018-06-18 LAB — LIPASE, BLOOD: Lipase: 26 U/L (ref 11–51)

## 2018-06-18 NOTE — ED Notes (Signed)
Patient states that she wants to leave AMA; states that she wants to call an ambulance to go to [facility]. Explained that if patient left here and called ambulance that county will take patient to the nearest facility to treat emergency [facility]. Advised patient to wait to be seen to be treated.

## 2018-06-18 NOTE — ED Notes (Signed)
Follow up call made  06/18/18 0956 s Kristyl Athens rn

## 2018-06-29 DIAGNOSIS — E782 Mixed hyperlipidemia: Secondary | ICD-10-CM | POA: Diagnosis not present

## 2018-06-29 DIAGNOSIS — I10 Essential (primary) hypertension: Secondary | ICD-10-CM | POA: Diagnosis not present

## 2018-06-29 DIAGNOSIS — E786 Lipoprotein deficiency: Secondary | ICD-10-CM | POA: Diagnosis not present

## 2018-06-29 DIAGNOSIS — R7309 Other abnormal glucose: Secondary | ICD-10-CM | POA: Diagnosis not present

## 2018-07-09 ENCOUNTER — Encounter: Payer: Self-pay | Admitting: Family Medicine

## 2018-07-09 DIAGNOSIS — M1990 Unspecified osteoarthritis, unspecified site: Secondary | ICD-10-CM

## 2018-07-09 HISTORY — DX: Unspecified osteoarthritis, unspecified site: M19.90

## 2018-07-12 ENCOUNTER — Encounter: Payer: Self-pay | Admitting: Family Medicine

## 2018-10-17 ENCOUNTER — Other Ambulatory Visit: Payer: Self-pay

## 2018-10-17 ENCOUNTER — Emergency Department (HOSPITAL_COMMUNITY): Payer: BLUE CROSS/BLUE SHIELD

## 2018-10-17 ENCOUNTER — Encounter (HOSPITAL_COMMUNITY): Payer: Self-pay

## 2018-10-17 ENCOUNTER — Emergency Department (HOSPITAL_COMMUNITY)
Admission: EM | Admit: 2018-10-17 | Discharge: 2018-10-17 | Disposition: A | Discharge status: Home / Self Care | Payer: BLUE CROSS/BLUE SHIELD | Attending: Emergency Medicine | Admitting: Emergency Medicine

## 2018-10-17 DIAGNOSIS — B373 Candidiasis of vulva and vagina: Secondary | ICD-10-CM | POA: Diagnosis not present

## 2018-10-17 DIAGNOSIS — B3731 Acute candidiasis of vulva and vagina: Secondary | ICD-10-CM

## 2018-10-17 DIAGNOSIS — N939 Abnormal uterine and vaginal bleeding, unspecified: Secondary | ICD-10-CM | POA: Diagnosis not present

## 2018-10-17 DIAGNOSIS — N760 Acute vaginitis: Secondary | ICD-10-CM | POA: Diagnosis not present

## 2018-10-17 DIAGNOSIS — B9689 Other specified bacterial agents as the cause of diseases classified elsewhere: Secondary | ICD-10-CM | POA: Diagnosis not present

## 2018-10-17 DIAGNOSIS — N920 Excessive and frequent menstruation with regular cycle: Secondary | ICD-10-CM | POA: Insufficient documentation

## 2018-10-17 LAB — URINALYSIS, ROUTINE W REFLEX MICROSCOPIC
Bacteria, UA: NONE SEEN
Bilirubin Urine: NEGATIVE
Glucose, UA: NEGATIVE mg/dL
KETONES UR: NEGATIVE mg/dL
LEUKOCYTES UA: NEGATIVE
Nitrite: NEGATIVE
Protein, ur: 30 mg/dL — AB
Specific Gravity, Urine: 1.027 (ref 1.005–1.030)
pH: 6 (ref 5.0–8.0)

## 2018-10-17 LAB — WET PREP, GENITAL
Sperm: NONE SEEN
Trich, Wet Prep: NONE SEEN

## 2018-10-17 LAB — CBC WITH DIFFERENTIAL/PLATELET
Abs Immature Granulocytes: 0.02 10*3/uL (ref 0.00–0.07)
BASOS PCT: 1 %
Basophils Absolute: 0 10*3/uL (ref 0.0–0.1)
EOS ABS: 0.1 10*3/uL (ref 0.0–0.5)
EOS PCT: 2 %
HCT: 31.7 % — ABNORMAL LOW (ref 36.0–46.0)
Hemoglobin: 9.7 g/dL — ABNORMAL LOW (ref 12.0–15.0)
Immature Granulocytes: 0 %
Lymphocytes Relative: 46 %
Lymphs Abs: 2.6 10*3/uL (ref 0.7–4.0)
MCH: 26.9 pg (ref 26.0–34.0)
MCHC: 30.6 g/dL (ref 30.0–36.0)
MCV: 87.8 fL (ref 80.0–100.0)
MONOS PCT: 10 %
Monocytes Absolute: 0.6 10*3/uL (ref 0.1–1.0)
Neutro Abs: 2.3 10*3/uL (ref 1.7–7.7)
Neutrophils Relative %: 41 %
PLATELETS: 325 10*3/uL (ref 150–400)
RBC: 3.61 MIL/uL — ABNORMAL LOW (ref 3.87–5.11)
RDW: 16 % — AB (ref 11.5–15.5)
WBC: 5.6 10*3/uL (ref 4.0–10.5)
nRBC: 0 % (ref 0.0–0.2)

## 2018-10-17 LAB — BASIC METABOLIC PANEL
ANION GAP: 7 (ref 5–15)
BUN: 17 mg/dL (ref 6–20)
CALCIUM: 8.7 mg/dL — AB (ref 8.9–10.3)
CO2: 23 mmol/L (ref 22–32)
CREATININE: 0.78 mg/dL (ref 0.44–1.00)
Chloride: 110 mmol/L (ref 98–111)
GFR calc Af Amer: >60 mL/min (ref >60)
GLUCOSE: 102 mg/dL — AB (ref 70–99)
Potassium: 3.9 mmol/L (ref 3.5–5.1)
Sodium: 140 mmol/L (ref 135–145)

## 2018-10-17 LAB — I-STAT BETA HCG BLOOD, ED (MC, WL, AP ONLY): I-stat hCG, quantitative: <5 m[IU]/mL (ref <5)

## 2018-10-17 MED ORDER — METRONIDAZOLE 500 MG PO TABS: 500.0000 mg | ORAL_TABLET | Freq: Two times a day (BID) | ORAL | 0 refills | Status: DC

## 2018-10-17 MED ORDER — FLUCONAZOLE 150 MG PO TABS: 150.0000 mg | ORAL_TABLET | Freq: Once | ORAL | 0 refills | Status: DC

## 2018-10-17 MED ORDER — FLUCONAZOLE 150 MG PO TABS: 150.0000 mg | ORAL_TABLET | Freq: Once | ORAL | 0 refills | Status: AC

## 2018-10-17 NOTE — Discharge Instructions (Addendum)
Your ultrasound showed no significant abnormalities. Your exam did show bacterial vaginosis and yeast.  He will be treated with metronidazole.  Please do not drink alcohol while taking this medication.  He will also be treated with Diflucan which is a single tablet.  Please follow-up with OB/GYN if you are continuing to have heavy menstrual cycles. Seek immediate medical care for the reasons listed below Contact a health care provider if: You get light-headed or weak. You have nausea and vomiting. You cannot eat or drink without vomiting. You feel dizzy or have diarrhea while you are taking medicines. You are taking birth control pills or hormones, and you want to change them or stop taking them. Get help right away if: You develop a fever or chills. You need to change your sanitary pad or tampon more than one time per hour. Your bleeding becomes heavier, or your flow contains clots more often. You develop pain in your abdomen. You lose consciousness. You develop a rash.

## 2018-10-17 NOTE — ED Provider Notes (Signed)
[facility]-EMERGENCY DEPT Provider Note   CSN: [id number] Arrival date & time: 10/17/18  0855     History   Chief Complaint Chief Complaint  Patient presents with  . Vaginal Bleeding    HPI Christina Booth is a 42 y.o. female who presents the emergency department with chief complaint of heavy vaginal bleeding.  Patient began her menstrual cycle 4 days ago.  She states she normally has heavy bleeding with her.  However today was way outside of her normal menstrual cycle.  She said that this morning on her way to work she put in a tampon and had to heavy overnight pads in place.  She says it by the time she got to work she had blood through all of those and had bleeding down her pant legs.  When she went to the bathroom to check herself she says it a clot the size of a grapefruit came out of her and that has never happened before.  She is also been complaining of some lower abdominal cramping which she attributed to her menstrual cycle.  She does not follow with a gynecologist and does not know if she has a history of fibroids.  She is feeling somewhat fatigued but denies lightheadedness or shortness of breath.  She denies any potential for vaginal laceration.  She has some mild low back pain which she generally gets with her menstrual cycles but denies urinary symptoms, flank pain.  HPI  Past Medical History:  Diagnosis Date  . Back pain   . Osteoarthritis 07/09/2018    Patient Active Problem List   Diagnosis Date Noted  . Osteoarthritis 07/09/2018    Past Surgical History:  Procedure Laterality Date  . btl    . CHOLECYSTECTOMY    . UPPER GI ENDOSCOPY  04/14/2018   normal     OB History   None      Home Medications    Prior to Admission medications   Medication Sig Start Date End Date Taking? Authorizing Provider  chlorhexidine (HIBICLENS) 4 % external liquid Apply topically daily as needed. 10/16/16   Avanell Shackleton, NP-C    Family  History No family history on file.  Social History Social History   Tobacco Use  . Smoking status: Never Smoker  . Smokeless tobacco: Never Used  Substance Use Topics  . Alcohol use: No  . Drug use: No     Allergies   Patient has no known allergies.   Review of Systems Review of Systems  Ten systems reviewed and are negative for acute change, except as noted in the HPI.   Physical Exam Updated Vital Signs BP 132/68 (BP Location: Right Arm)   Pulse 81   Temp 98.8 F (37.1 C) (Oral)   Ht 5\' 5"  (1.651 m)   Wt 127 kg   LMP 10/03/2018 (Approximate)   BMI 46.59 kg/m   Physical Exam  Constitutional: She is oriented to person, place, and time. She appears well-developed and well-nourished. No distress.  HENT:  Head: Normocephalic and atraumatic.  Eyes: Pupils are equal, round, and reactive to light. Conjunctivae and EOM are normal. No scleral icterus.  Neck: Normal range of motion.  Cardiovascular: Normal rate, regular rhythm and normal heart sounds. Exam reveals no gallop and no friction rub.  No murmur heard. Pulmonary/Chest: Effort normal and breath sounds normal. No respiratory distress.  Abdominal: Soft. Bowel sounds are normal. She exhibits no distension and no mass. There is no tenderness. There is  no guarding.  Genitourinary:  Genitourinary Comments: Pelvic exam: VULVA: normal appearing vulva with no masses, tenderness or lesions, VAGINA: normal appearing vagina with normal color and discharge, no lesions, CERVIX: DNA probe for chlamydia and GC obtained, cervical motion tenderness absent, Nabothian cyst at 10 o'clock, Heavy bleeding from cervical os, exam limited by body habitus, Mild TTP BL adnexa exam chaperoned by NT Johnna   Neurological: She is alert and oriented to person, place, and time.  Skin: Skin is warm and dry. She is not diaphoretic.  Psychiatric: Her behavior is normal.  Nursing note and vitals reviewed.    ED Treatments / Results  Labs (all  labs ordered are listed, but only abnormal results are displayed) Labs Reviewed  CBC WITH DIFFERENTIAL/PLATELET - Abnormal; Notable for the following components:      Result Value   RBC 3.61 (*)    Hemoglobin 9.7 (*)    HCT 31.7 (*)    RDW 16.0 (*)    All other components within normal limits  WET PREP, GENITAL  BASIC METABOLIC PANEL  RPR  HIV ANTIBODY (ROUTINE TESTING W REFLEX)  URINALYSIS, ROUTINE W REFLEX MICROSCOPIC  I-STAT BETA HCG BLOOD, ED ([facility], [facility], [facility] ONLY)  GC/CHLAMYDIA PROBE AMP ([place]) NOT AT [facility]    EKG None  Radiology No results found.  Procedures Procedures (including critical care time)  Medications Ordered in ED Medications - No data to display   Initial Impression / Assessment and Plan / ED Course  I have reviewed the triage vital signs and the nursing notes.  Pertinent labs & imaging results that were available during my care of the patient were reviewed by me and considered in my medical decision making (see chart for details).   Patient has been lying in bed.  She has not had excessive  Patient with heavy menstrual bleeding.  Patient has had some decline in her blood pressure but no change in her heart rate and is not feeling orthostatics I do not think this represents significant hemorrhage.  The patient has been lying for a few hours in bed.  She denies any severe pain.  Review of her ultrasound shows no significant abnormalities.  She does not have any evidence of torsion, review of her labs shows no evidence of pregnancy.  She does have BV and yeast and will be treated with Diflucan and Flagyl.  I discussed avoiding alcohol with use of Flagyl.  I have advised her that she needs to follow closely with GYN and have discussed return precautions including feelings of significant lightheadedness, feeling of racing heart, shortness of breath, soaking through one pad every 30 minutes.  She is also advised [facility]  [facility].  She appears appropriate for discharge at this time. Final Clinical Impressions(s) / ED Diagnoses   Final diagnoses:  None    ED Discharge Orders    None       Arthor CaptainHarris, Eliot Bencivenga, PA-C 10/17/18 1655    Shaune PollackIsaacs, Cameron, MD 10/19/18 [phone]

## 2018-10-17 NOTE — ED Triage Notes (Signed)
He c/o vaginal bleeding with liquid blood and clots "heavy" x 3-4 days. She is in no distress. She also mentions some mild low back/flanks area discomfort. She denies fever/dysuria and is in no distress.

## 2018-10-18 LAB — HIV ANTIBODY (ROUTINE TESTING W REFLEX): HIV Screen 4th Generation wRfx: NONREACTIVE

## 2018-10-18 LAB — GC/CHLAMYDIA PROBE AMP (~~LOC~~) NOT AT ARMC
Chlamydia: NEGATIVE
Neisseria Gonorrhea: NEGATIVE

## 2018-10-19 LAB — RPR: RPR: NONREACTIVE

## 2018-11-02 ENCOUNTER — Encounter: Payer: Self-pay | Admitting: Family Medicine

## 2018-11-02 ENCOUNTER — Ambulatory Visit: Payer: BLUE CROSS/BLUE SHIELD | Admitting: Family Medicine

## 2018-11-02 VITALS — BP 128/82 | HR 68 | Temp 98.1°F | Wt 290.0 lb

## 2018-11-02 DIAGNOSIS — M549 Dorsalgia, unspecified: Secondary | ICD-10-CM

## 2018-11-02 DIAGNOSIS — M461 Sacroiliitis, not elsewhere classified: Secondary | ICD-10-CM

## 2018-11-02 NOTE — Patient Instructions (Signed)
Here heat for 20 minutes 3 times.. Do some basic heat stretching the Aleve.  Proper posturing sitting lifting and standing

## 2018-11-02 NOTE — Progress Notes (Signed)
   Subjective:    Patient ID: Adella NissenLatiesha Leeray Govan, female    DOB: July 11, 1976, 42 y.o.   MRN: [id number]  HPI She has a one-week history of difficulty with upper back pain as well as some pain in her lower back.  No history of injury or overuse.  She has been using 3 Aleve once per day as well as using Voltaren cream.  No numbness, weakness or tingling.   Review of Systems     Objective:   Physical Exam Exam of the upper back shows no tenderness palpation with normal motion of her neck.  Exam of the low back does show some tenderness over the left SI joint with Pearlean BrownieFaber testing being positive.  Negative straight leg raising and normal hip motion.       Assessment & Plan:  Upper back pain  Sacroiliitis (HCC) Recommend heat, stretching, proper posturing lifting sitting and standing.  Demonstrated these to her.  She is also to take 2 Aleve twice per day At the end of the encounter she then mentioned weight loss.  I recommended that she return here for further discussion of that.  Apparently she did use phentermine in the past with good results but I explained that there are many other options.

## 2018-11-03 ENCOUNTER — Institutional Professional Consult (permissible substitution): Payer: BLUE CROSS/BLUE SHIELD | Admitting: Family Medicine

## 2018-12-29 ENCOUNTER — Encounter: Payer: Self-pay | Admitting: Family Medicine

## 2018-12-29 ENCOUNTER — Ambulatory Visit: Payer: BLUE CROSS/BLUE SHIELD | Admitting: Family Medicine

## 2018-12-29 DIAGNOSIS — R5383 Other fatigue: Secondary | ICD-10-CM

## 2018-12-29 DIAGNOSIS — G4719 Other hypersomnia: Secondary | ICD-10-CM

## 2018-12-29 DIAGNOSIS — Z9189 Other specified personal risk factors, not elsewhere classified: Secondary | ICD-10-CM

## 2018-12-29 DIAGNOSIS — D649 Anemia, unspecified: Secondary | ICD-10-CM

## 2018-12-29 DIAGNOSIS — I10 Essential (primary) hypertension: Secondary | ICD-10-CM

## 2018-12-29 DIAGNOSIS — R319 Hematuria, unspecified: Secondary | ICD-10-CM | POA: Diagnosis not present

## 2018-12-29 DIAGNOSIS — R635 Abnormal weight gain: Secondary | ICD-10-CM | POA: Diagnosis not present

## 2018-12-29 DIAGNOSIS — R3121 Asymptomatic microscopic hematuria: Secondary | ICD-10-CM | POA: Diagnosis not present

## 2018-12-29 HISTORY — DX: Morbid (severe) obesity due to excess calories: E66.01

## 2018-12-29 LAB — POCT URINALYSIS DIP (PROADVANTAGE DEVICE)
BILIRUBIN UA: NEGATIVE mg/dL
Bilirubin, UA: NEGATIVE
Glucose, UA: NEGATIVE mg/dL
Leukocytes, UA: NEGATIVE
Nitrite, UA: NEGATIVE
PH UA: 6 (ref 5.0–8.0)
SPECIFIC GRAVITY, URINE: 1.03
UUROB: NEGATIVE

## 2018-12-29 NOTE — Progress Notes (Signed)
Subjective:    Patient ID: Christina Booth, female    DOB: 05/04/1976, 43 y.o.   MRN: [id number]  HPI Chief Complaint  Patient presents with  . fatigue    fatigue for 6 months. weight gain- loss 48 pounds and then gained half back within a year. would like something for weight loss   She is here with complains of a 6 month history of fatigue and weight gain. States fatigue is out of proportion to her activity. States she does not eat enough calories to justify weight gain.   Reports not sleeping well, waking up very tired and feels excessively sleepy during the day.  Sleeps on her stomach. Only gets 4 1/2 hours per night.   Works 11pm -7am Tues and Thur Fri-Sun 7 p - 7a Also works during the daytime some days.   States she works in [employer] during the day.  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   States she eats baked or grilled chicken or fish. Vegetables. Rice or potato or pasta. No sweets or desserts.  Sweet tea with supper. Water otherwise.  Eating 2 meals per day.   LMP: first week in January and states her periods are regular. She does report having heavy periods.  Contraception: tubal ligation   Reports taking daily blood pressure medication and blood pressure at home is always in the normal range.  Taking iron 2 times per week for anemia.   Had blood in her urine at her previous visit so we recheck this today and she has persistent hematuria. No urinary symptoms.   Denies fever, chills, dizziness, chest pain, palpitations, shortness of breath, abdominal pain, N/V/D, LE edema.    Reviewed allergies, medications, past medical, surgical, family, and social history.    Review of Systems Pertinent positives and negatives in the history of present illness.     Objective:   Physical Exam BP 124/88   Pulse 73   Temp 98.3 F (36.8 C) (Oral)   Ht 5\' 7"  (1.702 m)   Wt 290 lb (131.5 kg)   BMI 45.42 kg/m   Alert and oriented and in no  distress.  Conjunctiva normal.  Pharyngeal area is normal. Neck is supple without adenopathy or thyromegaly. Cardiac exam shows a regular sinus rhythm without murmurs or gallops. Lungs are clear to auscultation.  Extremities without edema, pulses intact.  PERRLA, CNs intact. Skin is warm and dry, no pallor.   Urinalysis dipstick: 3+ blood      Assessment & Plan:  Morbid obesity (HCC) - Plan: CBC with Differential/Platelet, Comprehensive metabolic panel, Hemoglobin A1c  Fatigue, unspecified type - Plan: CBC with Differential/Platelet, Comprehensive metabolic panel, TSH, T4, free, T3, VITAMIN D 25 Hydroxy (Vit-D Deficiency, Fractures), Vitamin B12, Hemoglobin A1c  Anemia, unspecified type - Plan: CBC with Differential/Platelet, Vitamin B12, Iron, TIBC and Ferritin Panel  Hematuria, unspecified type - Plan: POCT Urinalysis DIP (Proadvantage Device), Urine Culture  Unexplained weight gain - Plan: TSH, T4, free, T3  Excessive daytime sleepiness - Plan: Home sleep test  At risk for sleep apnea - Plan: Home sleep test  Essential hypertension - Plan: CBC with Differential/Platelet, Comprehensive metabolic panel  Asymptomatic microscopic hematuria - Plan: Ambulatory referral to Urology, Urinalysis, microscopic only  Epworth sleepiness scale 10  Referral for home sleep study.  She is at risk for sleep apnea and I have suspicion that this may be contributing to her fatigue. We discussed multiple etiologies for fatigue including anemia.  She has a history of anemia and currently is only taking iron a couple of days per week.  Reports having heavy periods. Encouraged her to start taking iron daily and to take a stool softener with it.  We will recheck hemoglobin and iron studies. Counseling on healthy diet and exercise for weight loss.  She has tried multiple weight loss medications in the past including phentermine and Topamax.  She thinks she may have also taken a long acting pill.  She has tried  multiple diets.  Discussed the possibility of trying Saxenda if this is affordable for her.  She will check with her insurance company and let me know.  No obvious contraindication to starting this. I also recommended a referral to [facility] if she prefers but she would like to hold off on this for now. Blood pressure is well controlled currently and she will continue on current medication. Persistent asymptomatic hematuria.  Microscopic urinalysis and urine culture ordered.  Referral to urology for further evaluation.

## 2018-12-29 NOTE — Patient Instructions (Addendum)
Take iron daily with a stool softener.   Check with your insurance company regarding Saxenda, a long term weight loss medication.   You will receive a call regarding the home sleep test to screen for sleep apnea.   We will call you with your lab results.

## 2018-12-30 ENCOUNTER — Encounter: Payer: Self-pay | Admitting: Family Medicine

## 2018-12-30 ENCOUNTER — Other Ambulatory Visit: Payer: Self-pay | Admitting: Family Medicine

## 2018-12-30 DIAGNOSIS — R7303 Prediabetes: Secondary | ICD-10-CM | POA: Insufficient documentation

## 2018-12-30 DIAGNOSIS — E559 Vitamin D deficiency, unspecified: Secondary | ICD-10-CM

## 2018-12-30 HISTORY — DX: Prediabetes: R73.03

## 2018-12-30 HISTORY — DX: Vitamin D deficiency, unspecified: E55.9

## 2018-12-30 LAB — TSH: TSH: 3.72 u[IU]/mL (ref 0.450–4.500)

## 2018-12-30 LAB — COMPREHENSIVE METABOLIC PANEL
ALBUMIN: 4 g/dL (ref 3.8–4.8)
ALK PHOS: 64 IU/L (ref 39–117)
ALT: 7 IU/L (ref 0–32)
AST: 12 IU/L (ref 0–40)
Albumin/Globulin Ratio: 1.4 (ref 1.2–2.2)
BILIRUBIN TOTAL: 0.2 mg/dL (ref 0.0–1.2)
BUN / CREAT RATIO: 14 (ref 9–23)
BUN: 13 mg/dL (ref 6–24)
CHLORIDE: 102 mmol/L (ref 96–106)
CO2: 21 mmol/L (ref 20–29)
CREATININE: 0.93 mg/dL (ref 0.57–1.00)
Calcium: 9.7 mg/dL (ref 8.7–10.2)
GFR calc Af Amer: 89 mL/min/{1.73_m2} (ref >59)
GFR calc non Af Amer: 77 mL/min/{1.73_m2} (ref >59)
GLOBULIN, TOTAL: 2.9 g/dL (ref 1.5–4.5)
GLUCOSE: 90 mg/dL (ref 65–99)
Potassium: 4.2 mmol/L (ref 3.5–5.2)
SODIUM: 138 mmol/L (ref 134–144)
Total Protein: 6.9 g/dL (ref 6.0–8.5)

## 2018-12-30 LAB — URINE CULTURE

## 2018-12-30 LAB — CBC WITH DIFFERENTIAL/PLATELET
BASOS ABS: 0 10*3/uL (ref 0.0–0.2)
Basos: 1 %
EOS (ABSOLUTE): 0.1 10*3/uL (ref 0.0–0.4)
Eos: 2 %
HEMOGLOBIN: 10.3 g/dL — AB (ref 11.1–15.9)
Hematocrit: 31 % — ABNORMAL LOW (ref 34.0–46.6)
Immature Grans (Abs): 0 10*3/uL (ref 0.0–0.1)
Immature Granulocytes: 0 %
LYMPHS ABS: 3.8 10*3/uL — AB (ref 0.7–3.1)
Lymphs: 49 %
MCH: 27.2 pg (ref 26.6–33.0)
MCHC: 33.2 g/dL (ref 31.5–35.7)
MCV: 82 fL (ref 79–97)
MONOCYTES: 8 %
MONOS ABS: 0.6 10*3/uL (ref 0.1–0.9)
NEUTROS ABS: 3 10*3/uL (ref 1.4–7.0)
Neutrophils: 40 %
Platelets: 347 10*3/uL (ref 150–450)
RBC: 3.78 x10E6/uL (ref 3.77–5.28)
RDW: 15.1 % (ref 11.7–15.4)
WBC: 7.5 10*3/uL (ref 3.4–10.8)

## 2018-12-30 LAB — URINALYSIS, MICROSCOPIC ONLY
CASTS: NONE SEEN /LPF
RBC, UA: >30 /hpf — AB (ref 0–2)

## 2018-12-30 LAB — HEMOGLOBIN A1C
Est. average glucose Bld gHb Est-mCnc: 123 mg/dL
HEMOGLOBIN A1C: 5.9 % — AB (ref 4.8–5.6)

## 2018-12-30 LAB — IRON,TIBC AND FERRITIN PANEL
Ferritin: 15 ng/mL (ref 15–150)
IRON: 30 ug/dL (ref 27–159)
Iron Saturation: 9 % — CL (ref 15–55)
Total Iron Binding Capacity: 333 ug/dL (ref 250–450)
UIBC: 303 ug/dL (ref 131–425)

## 2018-12-30 LAB — VITAMIN D 25 HYDROXY (VIT D DEFICIENCY, FRACTURES): Vit D, 25-Hydroxy: 8.5 ng/mL — ABNORMAL LOW (ref 30.0–100.0)

## 2018-12-30 LAB — VITAMIN B12: Vitamin B-12: 773 pg/mL (ref 232–1245)

## 2018-12-30 LAB — T3: T3, Total: 117 ng/dL (ref 71–180)

## 2018-12-30 LAB — T4, FREE: Free T4: 1.12 ng/dL (ref 0.82–1.77)

## 2018-12-30 MED ORDER — VITAMIN D (ERGOCALCIFEROL) 1.25 MG (50000 UNIT) PO CAPS: 50000.0000 [IU] | ORAL_CAPSULE | ORAL | 0 refills | Status: DC

## 2018-12-30 MED ORDER — PHENTERMINE-TOPIRAMATE ER 3.75-23 MG PO CP24: ORAL_CAPSULE | ORAL | 0 refills | Status: DC

## 2018-12-30 MED ORDER — PHENTERMINE-TOPIRAMATE ER 7.5-46 MG PO CP24: ORAL_CAPSULE | ORAL | 0 refills | Status: DC

## 2018-12-30 NOTE — Telephone Encounter (Signed)
Pt was notified.. pt was informed to take lower dose for 14 days and then increase to higher dose and follow-up in 4 weeks. Pt was advised to watch bp

## 2018-12-30 NOTE — Telephone Encounter (Signed)
Pt states she does not have time to go through classes and a lot of appointments as she works 2 fulltime jobs so she does not want a referral.  Pt would like to know if you can call in qsymia for her.

## 2018-12-30 NOTE — Addendum Note (Signed)
Addended by: Herminio Commons A on: 12/30/2018 04:27 PM   Modules accepted: Orders

## 2018-12-30 NOTE — Telephone Encounter (Signed)
We can send this in for her but make sure she is aware that one of the medications in Qsymia is phentermine which is a stimulant. This medication can elevate her blood pressure so if she is seeing her BP higher than usual she may need to come off of it.  Make sure she knows that Qsymia needs to be titrated to stop it once being on it for a while.

## 2018-12-30 NOTE — Telephone Encounter (Signed)
I recommend a referral to [facility] if she agrees. Please find out and put in referral. There is no other medication that I recommend.

## 2018-12-30 NOTE — Telephone Encounter (Signed)
Pt called and state that her insurance will not cover the Saxenda so pt wants to know what else she can be put on, pt uses [id number] - [place], [place] - [address] pt can be reached at [phone]

## 2019-01-18 ENCOUNTER — Ambulatory Visit (HOSPITAL_BASED_OUTPATIENT_CLINIC_OR_DEPARTMENT_OTHER): Payer: BLUE CROSS/BLUE SHIELD | Attending: Family Medicine

## 2019-02-23 ENCOUNTER — Other Ambulatory Visit: Payer: Self-pay

## 2019-02-23 ENCOUNTER — Encounter: Payer: Self-pay | Admitting: Family Medicine

## 2019-02-23 ENCOUNTER — Ambulatory Visit (INDEPENDENT_AMBULATORY_CARE_PROVIDER_SITE_OTHER): Payer: BLUE CROSS/BLUE SHIELD | Admitting: Family Medicine

## 2019-02-23 VITALS — Temp 99.0°F | Ht 66.0 in | Wt 270.0 lb

## 2019-02-23 DIAGNOSIS — R0602 Shortness of breath: Secondary | ICD-10-CM

## 2019-02-23 DIAGNOSIS — R5383 Other fatigue: Secondary | ICD-10-CM | POA: Diagnosis not present

## 2019-02-23 NOTE — Progress Notes (Addendum)
   Subjective:    Patient ID: Christina Booth, female    DOB: January 08, 1976, 43 y.o.   MRN: [id number] Documentation for virtual telephone encounter.  Documentation for virtual audio and video telecommunications through Zoom encounter: The patient was located at home. The provider was located in the office. The patient did consent to this visit and is aware of possible charges through their insurance for this visit. The other persons participating in this telemedicine service were none. Time spent on call was 15 minutes  This virtual service is not related to other E/M service within previous 7 days.   HPI This is a virtual office visit. She has a 4-day history of started with chest tightness, fatigue and some diarrhea.  This continued and last night the chest tightness became more painful as well as some shortness of breath and fatigue as well as coughing.  No fever.  She states that a coworker's son has a virus and a coworker has been coming to work.  Also her daughter came back from a trip to [place] with a fever a few weeks ago.   Review of Systems     Objective:   Physical Exam Alert and not complaining of any distress.       Assessment & Plan:  Fatigue, unspecified type  Short of breath on exertion Recommend supportive care with use of 2 Tylenol 4 times per day as needed for fatigue, aches and pains as well as 2 Aleve twice per day.  Robitussin-DM during the day and NyQuil at night.  We will give her a note for out of work for the rest of the week. Discussed worsening of the symptoms especially if she gets fever and worsening shortness of breath.  Discussed the fact that this could be the coronavirus but we do not have a cure just supportive care measures.  She is to call me before going to the emergency room.

## 2019-02-28 ENCOUNTER — Emergency Department (HOSPITAL_COMMUNITY): Payer: BLUE CROSS/BLUE SHIELD

## 2019-02-28 ENCOUNTER — Emergency Department (HOSPITAL_COMMUNITY)
Admission: EM | Admit: 2019-02-28 | Discharge: 2019-02-28 | Disposition: A | Discharge status: Home / Self Care | Payer: BLUE CROSS/BLUE SHIELD | Attending: Emergency Medicine | Admitting: Emergency Medicine

## 2019-02-28 ENCOUNTER — Encounter (HOSPITAL_COMMUNITY): Payer: Self-pay | Admitting: Emergency Medicine

## 2019-02-28 ENCOUNTER — Other Ambulatory Visit: Payer: Self-pay

## 2019-02-28 DIAGNOSIS — R0789 Other chest pain: Secondary | ICD-10-CM | POA: Diagnosis not present

## 2019-02-28 DIAGNOSIS — Z79899 Other long term (current) drug therapy: Secondary | ICD-10-CM | POA: Insufficient documentation

## 2019-02-28 DIAGNOSIS — R0602 Shortness of breath: Secondary | ICD-10-CM | POA: Insufficient documentation

## 2019-02-28 DIAGNOSIS — R079 Chest pain, unspecified: Secondary | ICD-10-CM | POA: Diagnosis not present

## 2019-02-28 LAB — COMPREHENSIVE METABOLIC PANEL
ALBUMIN: 3.5 g/dL (ref 3.5–5.0)
ALT: 15 U/L (ref 0–44)
AST: 20 U/L (ref 15–41)
Alkaline Phosphatase: 50 U/L (ref 38–126)
Anion gap: 9 (ref 5–15)
BILIRUBIN TOTAL: 0.3 mg/dL (ref 0.3–1.2)
BUN: 15 mg/dL (ref 6–20)
CO2: 20 mmol/L — ABNORMAL LOW (ref 22–32)
CREATININE: 0.85 mg/dL (ref 0.44–1.00)
Calcium: 8.8 mg/dL — ABNORMAL LOW (ref 8.9–10.3)
Chloride: 108 mmol/L (ref 98–111)
GFR calc Af Amer: >60 mL/min (ref >60)
GLUCOSE: 97 mg/dL (ref 70–99)
Potassium: 3.7 mmol/L (ref 3.5–5.1)
Sodium: 137 mmol/L (ref 135–145)
TOTAL PROTEIN: 7.1 g/dL (ref 6.5–8.1)

## 2019-02-28 LAB — T4, FREE: Free T4: 0.89 ng/dL (ref 0.82–1.77)

## 2019-02-28 LAB — CBC WITH DIFFERENTIAL/PLATELET
ABS IMMATURE GRANULOCYTES: 0.02 10*3/uL (ref 0.00–0.07)
BASOS ABS: 0 10*3/uL (ref 0.0–0.1)
Basophils Relative: 0 %
Eosinophils Absolute: 0.2 10*3/uL (ref 0.0–0.5)
Eosinophils Relative: 2 %
HEMATOCRIT: 33.9 % — AB (ref 36.0–46.0)
HEMOGLOBIN: 10.4 g/dL — AB (ref 12.0–15.0)
IMMATURE GRANULOCYTES: 0 %
LYMPHS PCT: 51 %
Lymphs Abs: 3.9 10*3/uL (ref 0.7–4.0)
MCH: 26.9 pg (ref 26.0–34.0)
MCHC: 30.7 g/dL (ref 30.0–36.0)
MCV: 87.6 fL (ref 80.0–100.0)
MONOS PCT: 9 %
Monocytes Absolute: 0.7 10*3/uL (ref 0.1–1.0)
NEUTROS ABS: 2.9 10*3/uL (ref 1.7–7.7)
NEUTROS PCT: 38 %
Platelets: 202 10*3/uL (ref 150–400)
RBC: 3.87 MIL/uL (ref 3.87–5.11)
RDW: 16.1 % — ABNORMAL HIGH (ref 11.5–15.5)
WBC: 7.8 10*3/uL (ref 4.0–10.5)
nRBC: 0 % (ref 0.0–0.2)

## 2019-02-28 LAB — I-STAT BETA HCG BLOOD, ED (MC, WL, AP ONLY)

## 2019-02-28 LAB — LIPASE, BLOOD: Lipase: 21 U/L (ref 11–51)

## 2019-02-28 LAB — TSH: TSH: 3.888 u[IU]/mL (ref 0.350–4.500)

## 2019-02-28 LAB — D-DIMER, QUANTITATIVE: D-Dimer, Quant: 0.56 ug/mL-FEU — ABNORMAL HIGH (ref 0.00–0.50)

## 2019-02-28 LAB — TROPONIN I

## 2019-02-28 LAB — BRAIN NATRIURETIC PEPTIDE: B Natriuretic Peptide: 24.5 pg/mL (ref 0.0–100.0)

## 2019-02-28 MED ORDER — IOHEXOL 350 MG/ML SOLN
100.0000 mL | Freq: Once | INTRAVENOUS | Status: AC | PRN
  Administered 2019-02-28: 100 mL via INTRAVENOUS

## 2019-02-28 MED ORDER — SODIUM CHLORIDE (PF) 0.9 % IJ SOLN
INTRAMUSCULAR | Status: AC
  Filled 2019-02-28: qty 50

## 2019-02-28 MED ORDER — SODIUM CHLORIDE 0.9% FLUSH
3.0000 mL | Freq: Once | INTRAVENOUS | Status: AC
  Administered 2019-02-28: 3 mL via INTRAVENOUS

## 2019-02-28 MED ORDER — KETOROLAC TROMETHAMINE 30 MG/ML IJ SOLN
30.0000 mg | Freq: Once | INTRAMUSCULAR | Status: AC
  Administered 2019-02-28: 30 mg via INTRAVENOUS
  Filled 2019-02-28: qty 1

## 2019-02-28 NOTE — Discharge Instructions (Signed)
There is no evidence of heart attack or blood clot in the lung.  You should follow-up with your doctor for a mammogram to further evaluate the nodule in your right breast.  Your CT scan also showed some lung nodules which should be checked in 1 year to ensure stability.  Return to the ED if you have worsening shortness of breath, fever, chest pain or other concerns. Restart your iron and vitamin D supplementation.   Follow the quarantine guidelines below until you are symptom free for seven days.     Person Under Monitoring Name: Christina Booth  Location: [address] [place] [address]   Infection Prevention Recommendations for Individuals Confirmed to have, or Being Evaluated for, 2019 Novel Coronavirus (COVID-19) Infection Who Receive Care at Home  Individuals who are confirmed to have, or are being evaluated for, COVID-19 should follow the prevention steps below until a healthcare provider or local or state health department says they can return to normal activities.  Stay home except to get medical care You should restrict activities outside your home, except for getting medical care. Do not go to work, school, or public areas, and do not use public transportation or taxis.  Call ahead before visiting your doctor Before your medical appointment, call the healthcare provider and tell them that you have, or are being evaluated for, COVID-19 infection. This will help the healthcare providers office take steps to keep other people from getting infected. Ask your healthcare provider to call the local or state health department.  Monitor your symptoms Seek prompt medical attention if your illness is worsening (e.g., difficulty breathing). Before going to your medical appointment, call the healthcare provider and tell them that you have, or are being evaluated for, COVID-19 infection. Ask your healthcare provider to call the local or state health department.  Wear a  facemask You should wear a facemask that covers your nose and mouth when you are in the same room with other people and when you visit a healthcare provider. People who live with or visit you should also wear a facemask while they are in the same room with you.  Separate yourself from other people in your home As much as possible, you should stay in a different room from other people in your home. Also, you should use a separate bathroom, if available.  Avoid sharing household items You should not share dishes, drinking glasses, cups, eating utensils, towels, bedding, or other items with other people in your home. After using these items, you should wash them thoroughly with soap and water.  Cover your coughs and sneezes Cover your mouth and nose with a tissue when you cough or sneeze, or you can cough or sneeze into your sleeve. Throw used tissues in a lined trash can, and immediately wash your hands with soap and water for at least 20 seconds or use an alcohol-based hand rub.  Wash your [employer] your hands often and thoroughly with soap and water for at least 20 seconds. You can use an alcohol-based hand sanitizer if soap and water are not available and if your hands are not visibly dirty. Avoid touching your eyes, nose, and mouth with unwashed hands.   Prevention Steps for Caregivers and Household Members of Individuals Confirmed to have, or Being Evaluated for, COVID-19 Infection Being Cared for in the Home  If you live with, or provide care at home for, a person confirmed to have, or being evaluated for, COVID-19 infection please follow these  guidelines to prevent infection:  Follow healthcare providers instructions Make sure that you understand and can help the patient follow any healthcare provider instructions for all care.  Provide for the patients basic needs You should help the patient with basic needs in the home and provide support for getting groceries,  prescriptions, and other personal needs.  Monitor the patients symptoms If they are getting sicker, call his or her medical provider and tell them that the patient has, or is being evaluated for, COVID-19 infection. This will help the healthcare providers office take steps to keep other people from getting infected. Ask the healthcare provider to call the local or state health department.  Limit the number of people who have contact with the patient If possible, have only one caregiver for the patient. Other household members should stay in another home or place of residence. If this is not possible, they should stay in another room, or be separated from the patient as much as possible. Use a separate bathroom, if available. Restrict visitors who do not have an essential need to be in the home.  Keep older adults, very young children, and other sick people away from the patient Keep older adults, very young children, and those who have compromised immune systems or chronic health conditions away from the patient. This includes people with chronic heart, lung, or kidney conditions, diabetes, and cancer.  Ensure good ventilation Make sure that shared spaces in the home have good air flow, such as from an air conditioner or an opened window, weather permitting.  Wash your hands often Wash your hands often and thoroughly with soap and water for at least 20 seconds. You can use an alcohol based hand sanitizer if soap and water are not available and if your hands are not visibly dirty. Avoid touching your eyes, nose, and mouth with unwashed hands. Use disposable paper towels to dry your hands. If not available, use dedicated cloth towels and replace them when they become wet.  Wear a facemask and gloves Wear a disposable facemask at all times in the room and gloves when you touch or have contact with the patients blood, body fluids, and/or secretions or excretions, such as sweat, saliva,  sputum, nasal mucus, vomit, urine, or feces.  Ensure the mask fits over your nose and mouth tightly, and do not touch it during use. Throw out disposable facemasks and gloves after using them. Do not reuse. Wash your hands immediately after removing your facemask and gloves. If your personal clothing becomes contaminated, carefully remove clothing and launder. Wash your hands after handling contaminated clothing. Place all used disposable facemasks, gloves, and other waste in a lined container before disposing them with other household waste. Remove gloves and wash your hands immediately after handling these items.  Do not share dishes, glasses, or other household items with the patient Avoid sharing household items. You should not share dishes, drinking glasses, cups, eating utensils, towels, bedding, or other items with a patient who is confirmed to have, or being evaluated for, COVID-19 infection. After the person uses these items, you should wash them thoroughly with soap and water.  Wash laundry thoroughly Immediately remove and wash clothes or bedding that have blood, body fluids, and/or secretions or excretions, such as sweat, saliva, sputum, nasal mucus, vomit, urine, or feces, on them. Wear gloves when handling laundry from the patient. Read and follow directions on labels of laundry or clothing items and detergent. In general, wash and dry with the warmest temperatures  recommended on the label.  Clean all areas the individual has used often Clean all touchable surfaces, such as counters, tabletops, doorknobs, bathroom fixtures, toilets, phones, keyboards, tablets, and bedside tables, every day. Also, clean any surfaces that may have blood, body fluids, and/or secretions or excretions on them. Wear gloves when cleaning surfaces the patient has come in contact with. Use a diluted bleach solution (e.g., dilute bleach with 1 part bleach and 10 parts water) or a household disinfectant with a  label that says EPA-registered for coronaviruses. To make a bleach solution at home, add 1 tablespoon of bleach to 1 quart (4 cups) of water. For a larger supply, add  cup of bleach to 1 gallon (16 cups) of water. Read labels of cleaning products and follow recommendations provided on product labels. Labels contain instructions for safe and effective use of the cleaning product including precautions you should take when applying the product, such as wearing gloves or eye protection and making sure you have good ventilation during use of the product. Remove gloves and wash hands immediately after cleaning.  Monitor yourself for signs and symptoms of illness Caregivers and household members are considered close contacts, should monitor their health, and will be asked to limit movement outside of the home to the extent possible. Follow the monitoring steps for close contacts listed on the symptom monitoring form.   ? If you have additional questions, contact your local health department or call the epidemiologist on call at [phone] (available 24/7). ? This guidance is subject to change. For the most up-to-date guidance from [facility], please refer to their website: [email or web address]

## 2019-02-28 NOTE — ED Provider Notes (Addendum)
[facility]-EMERGENCY DEPT Provider Note   CSN: [id number] Arrival date & time: 02/28/19  0300    History   Chief Complaint Chief Complaint  Christina Booth presents with  . Chest Pain    HPI Christina Booth is a 43 y.o. female.     Christina Booth with history of obesity, prediabetes, hypertension presenting with a 10-day history of right-sided chest pain that is fairly constant.  It is worse with palpation and movement.  Christina Booth has been taking ibuprofen at home for this without relief.  Christina Booth is also been taking Robitussin for Christina Booth cough.  States Christina Booth has intermittent episodes of palpitations lasting for several seconds at a time that come and go fairly frequently.  Today Christina Booth was woken up from sleep with an episode of palpitations and shortness of breath and dizziness.  This lasted for several seconds and is now improved.  Christina Booth still has persistent pain in the right side of Christina Booth chest as well as a feeling of generalized fatigue.  States Christina Booth is not felt well for more than 10 days now and called Christina Booth doctor who told Christina Booth to take Robitussin and ibuprofen.  Christina Booth had a telehealth visit 5 days ago for progressive fatigue which is a chronic issue for Christina Booth.  Denies any cardiac or pulmonary history.  Christina Booth does not smoke.  Christina Booth is had chills but no documented fever.  No recent out of the country travel.  States Christina Booth was around someone at work whose relative tested positive for corona virus.  Christina Booth denies any fever or productive cough.  No leg pain or leg swelling.  States Christina Booth was tested for sleep apnea which was negative.  Complains of generalized fatigue states Christina Booth does have a history of anemia and does not take iron as Christina Booth is supposed to.  No recent blood in the stool.  No vomiting.  The history is provided by the Christina Booth.    Past Medical History:  Diagnosis Date  . Back pain   . Morbid obesity (HCC) 12/29/2018  . Osteoarthritis 07/09/2018  . Prediabetes 12/30/2018  . Vitamin D deficiency  12/30/2018    Christina Booth Active Problem List   Diagnosis Date Noted  . Prediabetes 12/30/2018  . Vitamin D deficiency 12/30/2018  . Morbid obesity (HCC) 12/29/2018  . Anemia 12/29/2018  . Excessive daytime sleepiness 12/29/2018  . Osteoarthritis 07/09/2018    Past Surgical History:  Procedure Laterality Date  . btl    . CHOLECYSTECTOMY    . UPPER GI ENDOSCOPY  04/14/2018   normal     OB History   No obstetric history on file.      Home Medications    Prior to Admission medications   Medication Sig Start Date End Date Taking? Authorizing Provider  ferrous sulfate 325 (65 FE) MG tablet Take 325 mg by mouth daily with breakfast.    [provider]  Phentermine-Topiramate (QSYMIA) 7.5-46 MG CP24 Take 1 tablet daily with 1st meal of day 12/30/18   Henson, Vickie L, NP-C  triamterene-hydrochlorothiazide (MAXZIDE-25) 37.5-25 MG tablet Take 1 tablet by mouth daily.    [provider]  Vitamin D, Ergocalciferol, (DRISDOL) 1.25 MG (50000 UT) CAPS capsule Take 1 capsule (50,000 Units total) by mouth every 7 (seven) days. 12/30/18   Avanell Shackleton, NP-C    Family History History reviewed. No pertinent family history.  Social History Social History   Tobacco Use  . Smoking status: Never Smoker  . Smokeless tobacco: Never Used  Substance Use  Topics  . Alcohol use: No  . Drug use: No     Allergies   Christina Booth has no known allergies.   Review of Systems Review of Systems  Constitutional: Negative for activity change, appetite change, fatigue and fever.  HENT: Negative for congestion and rhinorrhea.   Respiratory: Positive for chest tightness and shortness of breath.   Cardiovascular: Negative for chest pain.  Gastrointestinal: Negative for abdominal pain, nausea and vomiting.  Genitourinary: Negative for dysuria and hematuria.  Musculoskeletal: Positive for myalgias. Negative for arthralgias.  Skin: Negative for rash.  Neurological: Positive for  dizziness, weakness and light-headedness. Negative for headaches.   all other systems are negative except as noted in the HPI and PMH.     Physical Exam Updated Vital Signs BP (!) 152/86 (BP Location: Right Wrist)   Pulse 72   Temp 99.2 F (37.3 C) (Oral)   Resp 18   Ht 5\' 6"  (1.676 m)   Wt 124.7 kg   LMP 02/02/2019 (Approximate)   SpO2 100%   BMI 44.39 kg/m   Physical Exam Vitals signs and nursing note reviewed.  Constitutional:      General: Christina Booth is not in acute distress.    Appearance: Christina Booth is well-developed. Christina Booth is obese.  HENT:     Head: Normocephalic and atraumatic.     Mouth/Throat:     Mouth: Mucous membranes are moist.     Pharynx: No oropharyngeal exudate.  Eyes:     Conjunctiva/sclera: Conjunctivae normal.     Pupils: Pupils are equal, round, and reactive to light.  Neck:     Musculoskeletal: Normal range of motion and neck supple.     Comments: No meningismus. Cardiovascular:     Rate and Rhythm: Normal rate and regular rhythm.     Heart sounds: Normal heart sounds. No murmur.  Pulmonary:     Effort: Pulmonary effort is normal. No respiratory distress.     Breath sounds: Normal breath sounds.     Comments: TTP R chest wall Chest:     Chest wall: Tenderness present.  Abdominal:     Palpations: Abdomen is soft.     Tenderness: There is no abdominal tenderness. There is no guarding or rebound.  Musculoskeletal: Normal range of motion.        General: No tenderness.  Skin:    General: Skin is warm.     Capillary Refill: Capillary refill takes less than 2 seconds.     Findings: No rash.  Neurological:     General: No focal deficit present.     Mental Status: Christina Booth is alert and oriented to person, place, and time. Mental status is at baseline.     Cranial Nerves: No cranial nerve deficit.     Motor: No abnormal muscle tone.     Coordination: Coordination normal.     Comments:  5/5 strength throughout. CN 2-12 intact.Equal grip strength.   Psychiatric:         Behavior: Behavior normal.      ED Treatments / Results  Labs (all labs ordered are listed, but only abnormal results are displayed) Labs Reviewed  CBC WITH DIFFERENTIAL/PLATELET - Abnormal; Notable for the following components:      Result Value   Hemoglobin 10.4 (*)    HCT 33.9 (*)    RDW 16.1 (*)    All other components within normal limits  COMPREHENSIVE METABOLIC PANEL - Abnormal; Notable for the following components:   CO2 20 (*)    Calcium  8.8 (*)    All other components within normal limits  D-DIMER, QUANTITATIVE (NOT AT [facility]) - Abnormal; Notable for the following components:   D-Dimer, Quant 0.56 (*)    All other components within normal limits  TROPONIN I  LIPASE, BLOOD  TSH  BRAIN NATRIURETIC PEPTIDE  T4, FREE  I-STAT BETA HCG BLOOD, ED ([facility], [facility], [facility] ONLY)    EKG EKG Interpretation  Date/Time:  Monday February 28 2019 03:07:55 EDT Ventricular Rate:  71 PR Interval:    QRS Duration: 98 QT Interval:  408 QTC Calculation: 444 R Axis:   12 Text Interpretation:  Sinus rhythm Low voltage, precordial leads No significant change was found Confirmed by Glynn Octave [phone]) on 02/28/2019 3:30:12 AM   Radiology Dg Chest 2 View  Result Date: 02/28/2019 CLINICAL DATA:  Chest pain and shortness of breath EXAM: CHEST - 2 VIEW COMPARISON:  04/07/2018 FINDINGS: Normal heart size and mediastinal contours. No acute infiltrate or edema. No effusion or pneumothorax. No acute osseous findings. Cholecystectomy clips. IMPRESSION: Negative chest. Electronically Signed   By: Marnee Spring M.D.   On: 02/28/2019 04:15   Ct Angio Chest Pe W And/or Wo Contrast  Result Date: 02/28/2019 CLINICAL DATA:  Chest pain and dyspnea.  Positive D-dimer EXAM: CT ANGIOGRAPHY CHEST WITH CONTRAST TECHNIQUE: Multidetector CT imaging of the chest was performed using the standard protocol during bolus administration of intravenous contrast. Multiplanar CT image reconstructions and MIPs were obtained  to evaluate the vascular anatomy. CONTRAST:  OMNIPAQUE IOHEXOL 350 MG/ML SOLN COMPARISON:  None available FINDINGS: Cardiovascular: Satisfactory opacification of the pulmonary arteries to the segmental level. No evidence of pulmonary embolism. Normal heart size. No pericardial effusion. Mediastinum/Nodes: Negative for adenopathy or mass. Lungs/Pleura: There is no edema, consolidation, effusion, or pneumothorax. At least 3 small subpleural pulmonary nodules measuring up to 4 mm. These are labeled on series 6. Upper Abdomen: Cholecystectomy Musculoskeletal: Negative. Other: Mildly lobulated 14 mm nodule in the superficial right breast. Review of the MIP images confirms the above findings. IMPRESSION: 1. Negative for pulmonary embolism or other acute finding. 2. Small subpleural pulmonary nodules likely reflecting lymph nodes. No follow-up needed if Christina Booth is low-risk. Non-contrast chest CT can be considered in 12 months if Christina Booth is high-risk. This recommendation follows the consensus statement: Guidelines for Management of Incidental Pulmonary Nodules Detected on CT Images: From the Fleischner Society 2017; Radiology 2017; 284:228-243. 3. 14 mm nodule in the right breast, recommend mammographic follow-up. Electronically Signed   By: Marnee Spring M.D.   On: 02/28/2019 05:49    Procedures Procedures (including critical care time)  Medications Ordered in ED Medications - No data to display   Initial Impression / Assessment and Plan / ED Course  I have reviewed the triage vital signs and the nursing notes.  Pertinent labs & imaging results that were available during my care of the Christina Booth were reviewed by me and considered in my medical decision making (see chart for details).       10 days of right-sided chest pain with intermittent palpitations, shortness of breath and fatigue.  EKG is sinus rhythm without acute ST changes. Chest wall is tender to palpation.  Low suspicion for ACS.  We  will check labs to evaluate for anemia as well as screening d-dimer and chest x-ray.  Labs show stable anemia.  Chest x-ray is negative. TSH is within normal limits.  Christina Booth's blood pressure and heart rate remained well controlled in the ED.  Low suspicion  for ACS, PE, aortic dissection.  Chest pain does appear to be musculoskeletal in origin.  For Christina Booth's body aches and fatigue this could be attributed from Christina Booth anemia though this is stable.  We will advised Christina Booth to go back on iron supplements. Consider viral syndrome such as influenza.  Christina Booth reports possible coronavirus exposure.  CT is negative for pulmonary embolism or pneumonia.  Does show small pleural nodules as well as right breast nodule.  These are discussed with Christina Booth as well as need for outpatient mammography.  Christina Booth will be instructed to self quarantine at home. Advised Christina Booth to restart both Christina Booth iron as well as vitamin D which Christina Booth has been noncompliant with. Thyroid studies are normal.  No hypoxia, fever or increased work of breathing. Follow up with PCP. Return precautions discussed.   Lowella DellLatiesha Linward FosterLeeray Dumais was evaluated in Emergency Department on 02/28/2019 for the symptoms described in the history of present illness. Christina Booth was evaluated in the context of the global COVID-19 pandemic, which necessitated consideration that the Christina Booth might be at risk for infection with the SARS-CoV-2 virus that causes COVID-19. Institutional protocols and algorithms that pertain to the evaluation of patients at risk for COVID-19 are in a state of rapid change based on information released by regulatory bodies including the CDC and federal and state organizations. These policies and algorithms were followed during the Christina Booth's care in the ED.   Final Clinical Impressions(s) / ED Diagnoses   Final diagnoses:  Atypical chest pain    ED Discharge Orders    None       Mitzie Marlar, Jeannett SeniorStephen, MD 02/28/19 [id number]    Glynn Octaveancour, Dore Oquin, MD 02/28/19  [phone]

## 2019-02-28 NOTE — ED Notes (Signed)
Patient transported to CT 

## 2019-02-28 NOTE — ED Notes (Signed)
Patient transported to X-ray 

## 2019-02-28 NOTE — ED Notes (Signed)
PT DISCHARGED. INSTRUCTIONS GIVEN. AAOX4. PT IN NO APPARENT DISTRESS WITH SEVERE PAIN. THE OPPORTUNITY TO ASK QUESTIONS WAS PROVIDED. 

## 2019-02-28 NOTE — ED Triage Notes (Signed)
Patient c/o chest pain and SOB for more than a week now. No reports of fever and travel history.

## 2019-03-03 ENCOUNTER — Other Ambulatory Visit: Payer: Self-pay | Admitting: Family Medicine

## 2019-03-03 MED ORDER — PHENTERMINE-TOPIRAMATE ER 7.5-46 MG PO CP24: 1.0000 | ORAL_CAPSULE | Freq: Every day | ORAL | 0 refills | Status: DC

## 2019-03-03 NOTE — Telephone Encounter (Signed)
Please let her know that I am not refilling the vitamin D since we have not rechecked it after 12 weeks of high dose treatment. She can take over the counter 800 or 1,000 IU daily until we follow up and recheck her labs. This can be in 4-8 weeks pending what happens with the current Coronavirus and the recommended guidelines. Thanks.

## 2019-03-03 NOTE — Telephone Encounter (Signed)
[facility] called for refills of Qsymia and vitamin D. Please send [facility]. Pt can be reached at [phone].

## 2019-03-03 NOTE — Telephone Encounter (Signed)
Qsymia will not e-prescribe by me. Please send in both meds that are pended

## 2019-03-03 NOTE — Telephone Encounter (Signed)
Left message for pt to call me back 

## 2019-03-03 NOTE — Telephone Encounter (Signed)
Please check on this and take care of it. Thanks.

## 2019-03-17 ENCOUNTER — Other Ambulatory Visit: Payer: Self-pay | Admitting: Family Medicine

## 2019-04-14 ENCOUNTER — Other Ambulatory Visit: Payer: Self-pay | Admitting: Family Medicine

## 2019-04-14 NOTE — Telephone Encounter (Signed)
Pt states that she had to stop taking this back in march due to finanicall issue due to covid- 19. She does have a whole bottle left. If she decides to start taking again and need a refill then she will schedule an appt

## 2019-04-14 NOTE — Telephone Encounter (Signed)
Is this okay to refill? 

## 2019-04-14 NOTE — Telephone Encounter (Signed)
She will need an office visit to check weight and see how she is doing.

## 2019-05-03 IMAGING — US US PELVIS COMPLETE
1 series · 13 of 25 positions shown · non-contrast
Comparison: None.

CLINICAL DATA: Heavy vaginal bleeding

EXAM:
TRANSABDOMINAL AND TRANSVAGINAL ULTRASOUND OF PELVIS
DOPPLER ULTRASOUND OF OVARIES
TECHNIQUE: Both transabdominal and transvaginal ultrasound examinations of the
pelvis were performed. Transabdominal technique was performed for
global imaging of the pelvis including uterus, ovaries, adnexal
regions, and pelvic cul-de-sac.
It was necessary to proceed with endovaginal exam following the
transabdominal exam to visualize the left ovary. Color and duplex
Doppler ultrasound was utilized to evaluate blood flow to the
ovaries.

[Series 1: us pelvis complete · 13 of 109 slices shown]
[im 1/109]
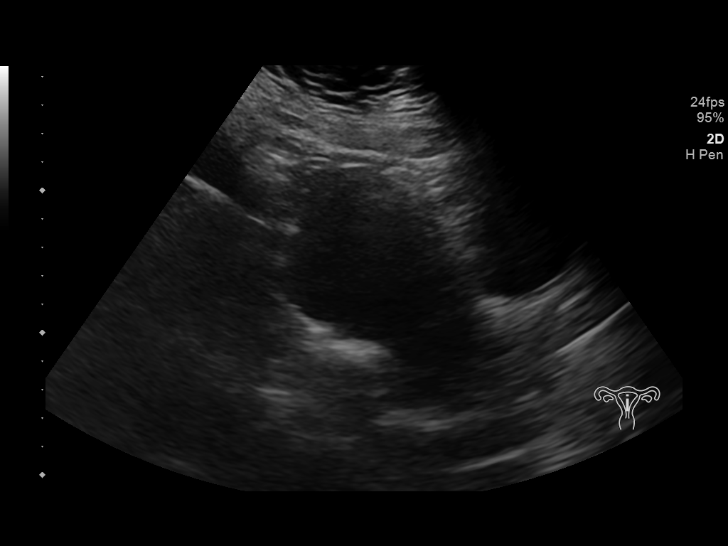
[im 10/109]
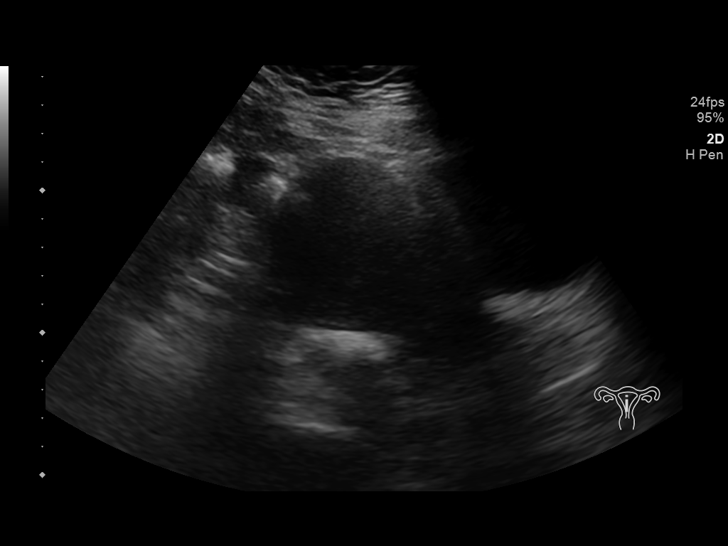
[im 19/109]
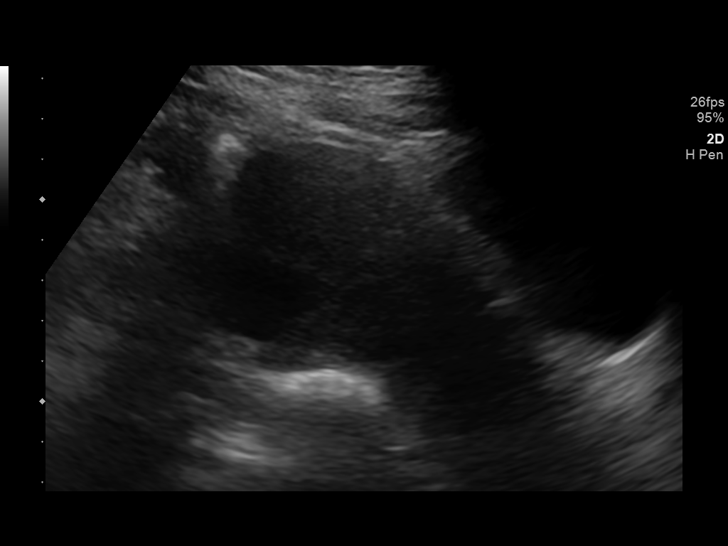
[im 28/109]
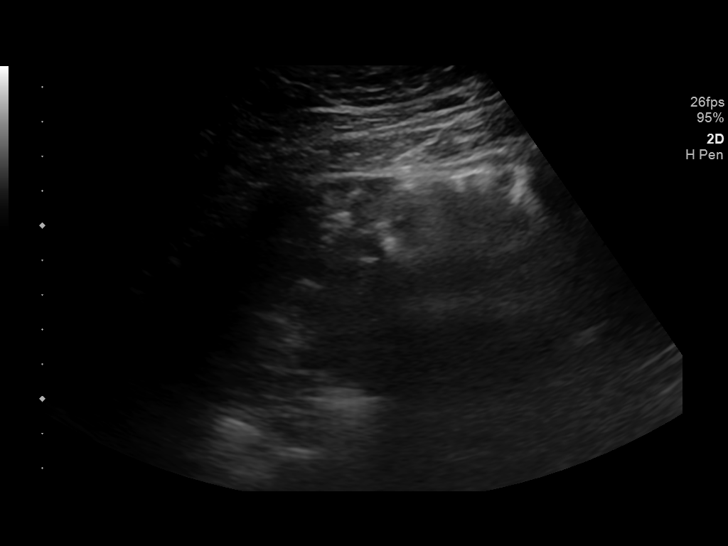
[im 37/109]
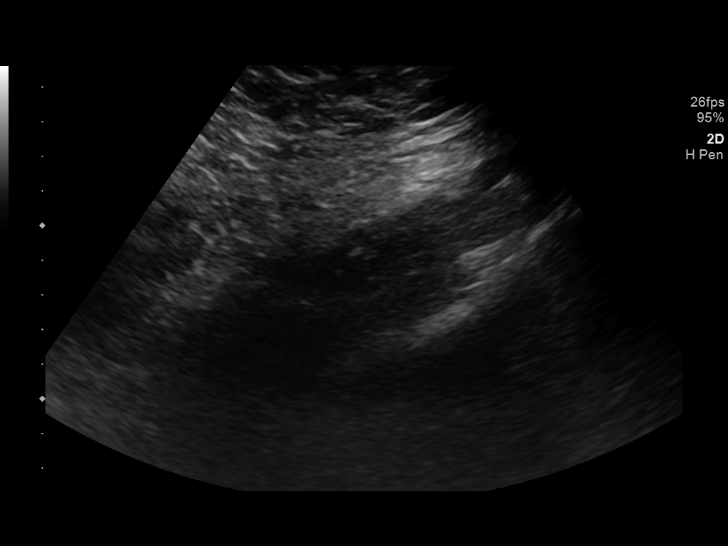
[im 46/109]
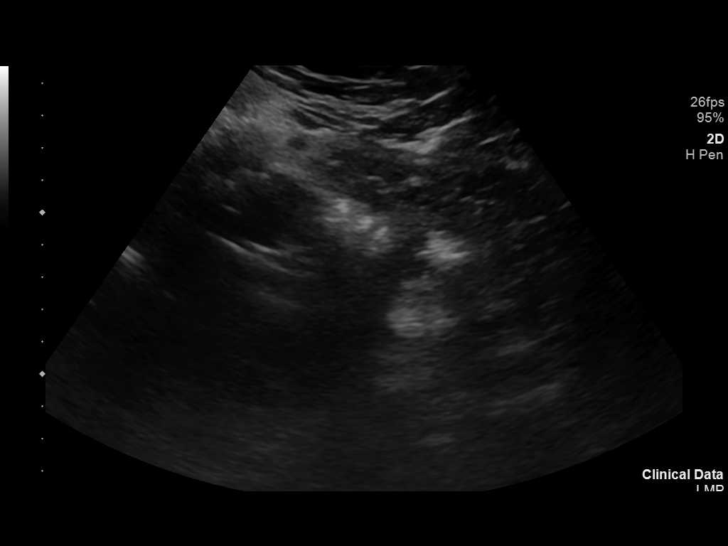
[im 55/109]
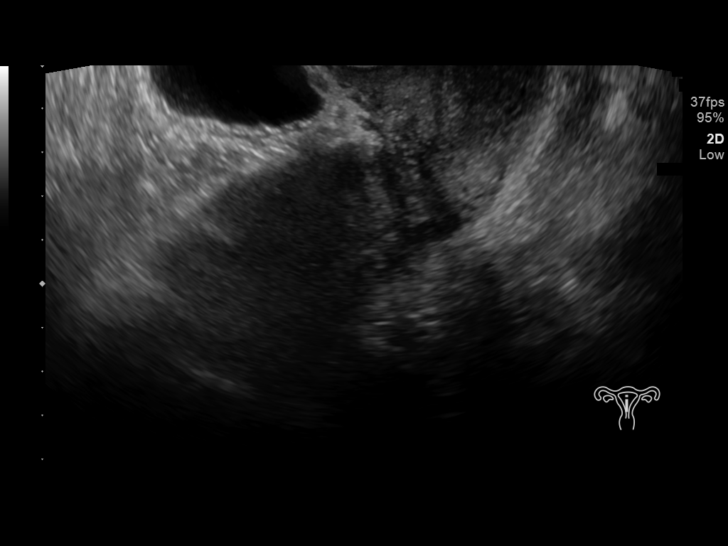
[im 64/109]
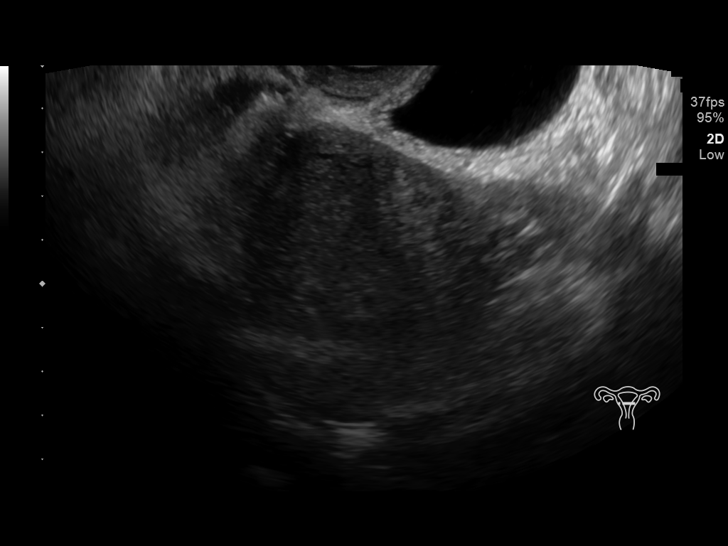
[im 73/109]
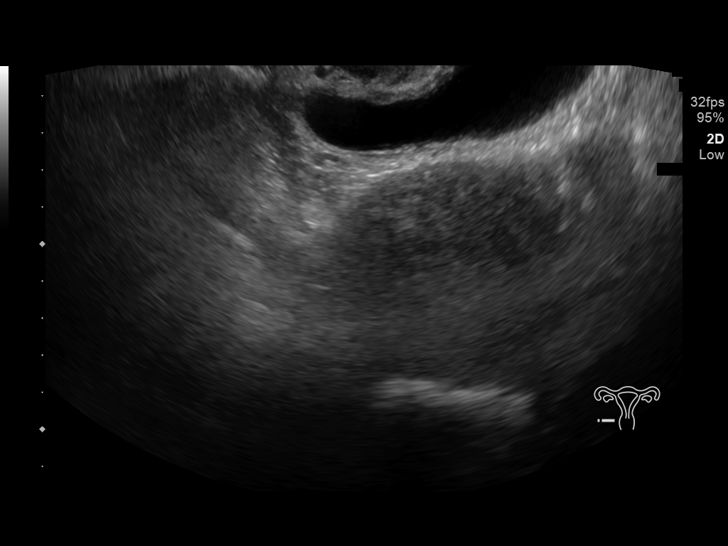
[im 82/109]
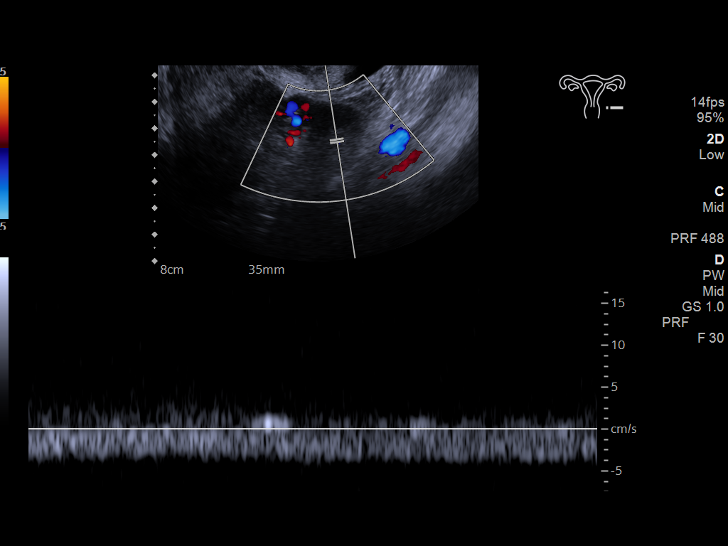
[im 91/109]
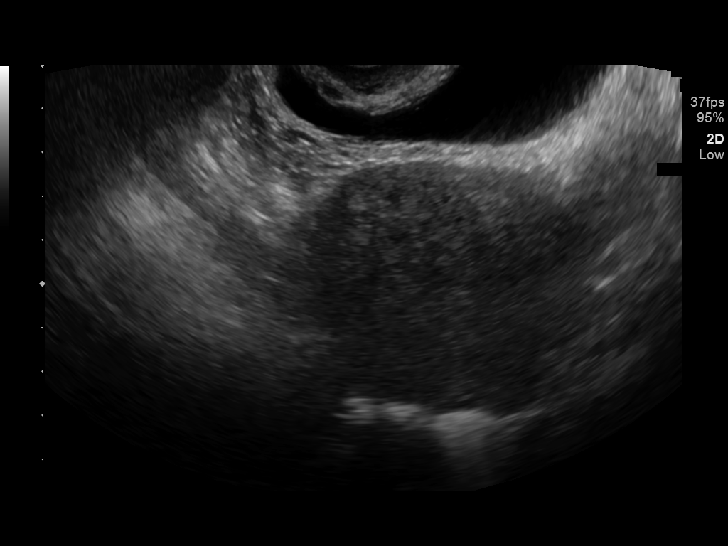
[im 100/109]
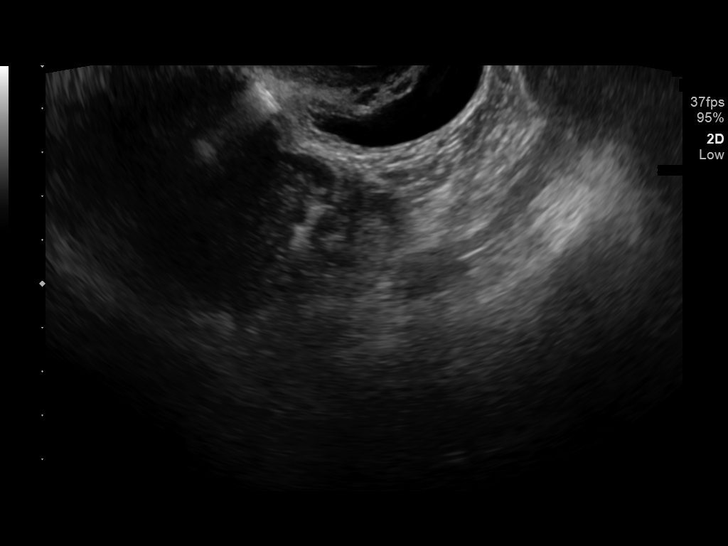
[im 109/109]
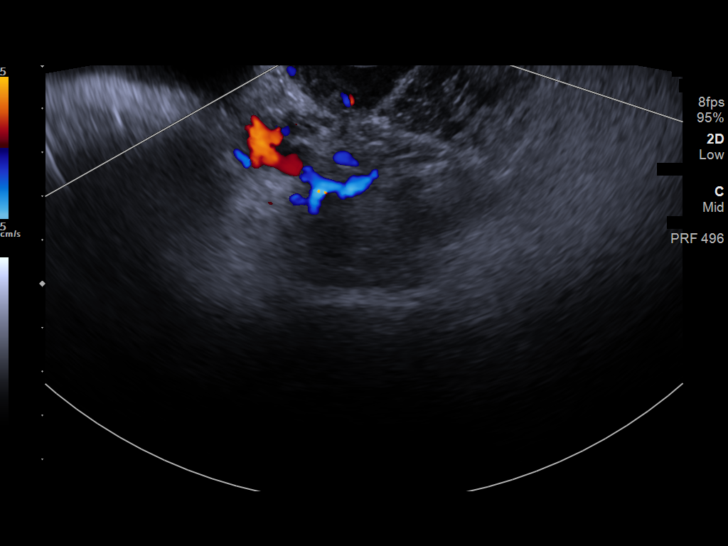

[13 of 25 positions shown; findings below may reference images not displayed]

FINDINGS: Uterus

Measurements: 11.6 x 6.4 x 6.0 cm = volume: 231.5 mL. No fibroids or
other mass visualized. Suspected adjacent loop of bowel on
transabdominal imaging.

Endometrium

Thickness: 5 mm.  No focal abnormality visualized.

Right ovary

Measurements: 3.8 x 2.9 x 3.1 cm = volume: 18 mL. Normal
appearance/no adnexal mass.

Left ovary

Measurements: 4.0 x 2.6 x 2.1 cm = volume: 11.6 mL. Normal
appearance/no adnexal mass.

Pulsed Doppler evaluation of the left ovary demonstrates normal
low-resistance arterial and venous waveforms. Right ovary was only
visualized transabdominal evaluation, and is notable for color
Doppler flow as well as a suspected venous spectral Doppler tracing,
although the arterial spectral tracing could not be obtained due to
technical limitations.

Other findings

No abnormal free fluid.
IMPRESSION: Endometrial complex measures 5 mm, within normal limits.

No findings suspicious for ovarian torsion.

## 2019-05-24 DIAGNOSIS — F4323 Adjustment disorder with mixed anxiety and depressed mood: Secondary | ICD-10-CM | POA: Diagnosis not present

## 2019-05-24 DIAGNOSIS — I10 Essential (primary) hypertension: Secondary | ICD-10-CM | POA: Diagnosis not present

## 2019-06-09 DIAGNOSIS — I10 Essential (primary) hypertension: Secondary | ICD-10-CM | POA: Diagnosis not present

## 2019-06-09 DIAGNOSIS — F418 Other specified anxiety disorders: Secondary | ICD-10-CM | POA: Diagnosis not present

## 2019-06-09 DIAGNOSIS — E1165 Type 2 diabetes mellitus with hyperglycemia: Secondary | ICD-10-CM | POA: Diagnosis not present

## 2019-11-14 ENCOUNTER — Other Ambulatory Visit: Payer: Self-pay

## 2019-11-14 ENCOUNTER — Emergency Department (HOSPITAL_COMMUNITY)
Admission: EM | Admit: 2019-11-14 | Discharge: 2019-11-14 | Disposition: A | Discharge status: Home / Self Care | Payer: BC Managed Care – PPO | Attending: Emergency Medicine | Admitting: Emergency Medicine

## 2019-11-14 ENCOUNTER — Emergency Department (HOSPITAL_COMMUNITY): Payer: BC Managed Care – PPO

## 2019-11-14 ENCOUNTER — Encounter (HOSPITAL_COMMUNITY): Payer: Self-pay

## 2019-11-14 DIAGNOSIS — R112 Nausea with vomiting, unspecified: Secondary | ICD-10-CM

## 2019-11-14 DIAGNOSIS — U071 COVID-19: Secondary | ICD-10-CM | POA: Insufficient documentation

## 2019-11-14 DIAGNOSIS — R05 Cough: Secondary | ICD-10-CM | POA: Diagnosis not present

## 2019-11-14 DIAGNOSIS — R531 Weakness: Secondary | ICD-10-CM | POA: Diagnosis not present

## 2019-11-14 DIAGNOSIS — Z79899 Other long term (current) drug therapy: Secondary | ICD-10-CM | POA: Diagnosis not present

## 2019-11-14 LAB — URINALYSIS, ROUTINE W REFLEX MICROSCOPIC
Bacteria, UA: NONE SEEN
Bilirubin Urine: NEGATIVE
Glucose, UA: NEGATIVE mg/dL
Ketones, ur: NEGATIVE mg/dL
Leukocytes,Ua: NEGATIVE
Nitrite: NEGATIVE
Protein, ur: NEGATIVE mg/dL
Specific Gravity, Urine: 1.021 (ref 1.005–1.030)
pH: 5 (ref 5.0–8.0)

## 2019-11-14 LAB — CBC
HCT: 36.1 % (ref 36.0–46.0)
Hemoglobin: 11.1 g/dL — ABNORMAL LOW (ref 12.0–15.0)
MCH: 26.4 pg (ref 26.0–34.0)
MCHC: 30.7 g/dL (ref 30.0–36.0)
MCV: 85.7 fL (ref 80.0–100.0)
Platelets: 318 10*3/uL (ref 150–400)
RBC: 4.21 MIL/uL (ref 3.87–5.11)
RDW: 15.9 % — ABNORMAL HIGH (ref 11.5–15.5)
WBC: 4.7 10*3/uL (ref 4.0–10.5)
nRBC: 0 % (ref 0.0–0.2)

## 2019-11-14 LAB — BASIC METABOLIC PANEL
Anion gap: 9 (ref 5–15)
BUN: 11 mg/dL (ref 6–20)
CO2: 23 mmol/L (ref 22–32)
Calcium: 8.9 mg/dL (ref 8.9–10.3)
Chloride: 106 mmol/L (ref 98–111)
Creatinine, Ser: 0.74 mg/dL (ref 0.44–1.00)
GFR calc Af Amer: >60 mL/min (ref >60)
GFR calc non Af Amer: >60 mL/min (ref >60)
Glucose, Bld: 86 mg/dL (ref 70–99)
Potassium: 3.8 mmol/L (ref 3.5–5.1)
Sodium: 138 mmol/L (ref 135–145)

## 2019-11-14 MED ORDER — ONDANSETRON HCL 4 MG PO TABS: 4.0000 mg | ORAL_TABLET | Freq: Four times a day (QID) | ORAL | 0 refills | Status: DC

## 2019-11-14 MED ORDER — ONDANSETRON HCL 4 MG PO TABS
4.0000 mg | ORAL_TABLET | Freq: Once | ORAL | Status: AC
  Administered 2019-11-14: 21:00:00 4 mg via ORAL
  Filled 2019-11-14: qty 1

## 2019-11-14 NOTE — ED Notes (Signed)
Patient given graham crackers and water for PO challenge. Patient tolerated well.

## 2019-11-14 NOTE — ED Triage Notes (Signed)
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 Pt states that she has had N/V/D. Pt states she has had a cough, fatigue, and weakness.

## 2019-11-14 NOTE — ED Provider Notes (Signed)
[facility]-EMERGENCY DEPT Provider Note   CSN: [id number] Arrival date & time: 11/14/19  1257     History Chief Complaint  Patient presents with  . Weakness  . Fatigue  . Cough    Christina Booth is a 43 y.o. female.  43 year old female with prior medical history detailed below presents for evaluation of nausea, vomiting, and some mild diarrhea as well.  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  Some of these patients and then diagnosed with Covid.  Patient requested Covid screen.  She denies abdominal pain.  She denies prior Covid testing.  The history is provided by the patient and medical records.  Weakness Severity:  Mild Onset quality:  Gradual Duration:  4 days Timing:  Constant Progression:  Waxing and waning Chronicity:  New Relieved by:  Nothing Worsened by:  Nothing Ineffective treatments:  None tried Associated symptoms: nausea and vomiting        Past Medical History:  Diagnosis Date  . Back pain   . Morbid obesity (HCC) 12/29/2018  . Osteoarthritis 07/09/2018  . Prediabetes 12/30/2018  . Vitamin D deficiency 12/30/2018    Patient Active Problem List   Diagnosis Date Noted  . Prediabetes 12/30/2018  . Vitamin D deficiency 12/30/2018  . Morbid obesity (HCC) 12/29/2018  . Anemia 12/29/2018  . Excessive daytime sleepiness 12/29/2018  . Osteoarthritis 07/09/2018    Past Surgical History:  Procedure Laterality Date  . btl    . CHOLECYSTECTOMY    . UPPER GI ENDOSCOPY  04/14/2018   normal     OB History   No obstetric history on file.     History reviewed. No pertinent family history.  Social History   Tobacco Use  . Smoking status: Never Smoker  . Smokeless tobacco: Never Used  Substance Use Topics  . Alcohol use: No  . Drug use: No    Home Medications Prior to Admission medications   Medication Sig Start Date End Date Taking?  Authorizing Provider  Ascorbic Acid (VITAMIN C PO) Take 1 tablet by mouth daily.   Yes [provider]  citalopram (CELEXA) 20 MG tablet Take 20 mg by mouth daily. 08/10/19  Yes [provider]  Cyanocobalamin (VITAMIN B 12) 500 MCG TABS Take 500 mcg by mouth daily.   Yes [provider]  lisinopril-hydrochlorothiazide (ZESTORETIC) 10-12.5 MG tablet Take 1 tablet by mouth daily. 08/10/19  Yes [provider]  Vitamin D, Ergocalciferol, (DRISDOL) 1.25 MG (50000 UT) CAPS capsule Take 1 capsule (50,000 Units total) by mouth every 7 (seven) days. Patient taking differently: Take 50,000 Units by mouth every Monday.  12/30/18  Yes Henson, Vickie L, NP-C    Allergies    Patient has no known allergies.  Review of Systems   Review of Systems  Gastrointestinal: Positive for nausea and vomiting.  Neurological: Positive for weakness.  All other systems reviewed and are negative.   Physical Exam Updated Vital Signs BP (!) 149/79 (BP Location: Left Arm)   Pulse 72   Temp 99.3 F (37.4 C) (Oral)   Resp 18   Wt 127.9 kg   LMP 10/24/2019   SpO2 100%   BMI 45.52 kg/m   Physical Exam Vitals and nursing note reviewed.  Constitutional:      General: She is not in acute distress.    Appearance: Normal appearance. She is well-developed.  HENT:     Head: Normocephalic  and atraumatic.  Eyes:     Conjunctiva/sclera: Conjunctivae normal.     Pupils: Pupils are equal, round, and reactive to light.  Cardiovascular:     Rate and Rhythm: Normal rate and regular rhythm.     Heart sounds: Normal heart sounds.  Pulmonary:     Effort: Pulmonary effort is normal. No respiratory distress.     Breath sounds: Normal breath sounds.  Abdominal:     General: Abdomen is flat. There is no distension.     Palpations: Abdomen is soft.     Tenderness: There is no abdominal tenderness.  Musculoskeletal:        General: No deformity. Normal range of motion.     Cervical back:  Normal range of motion and neck supple.  Skin:    General: Skin is warm and dry.  Neurological:     Mental Status: She is alert and oriented to person, place, and time.     ED Results / Procedures / Treatments   Labs (all labs ordered are listed, but only abnormal results are displayed) Labs Reviewed  CBC - Abnormal; Notable for the following components:      Result Value   Hemoglobin 11.1 (*)    RDW 15.9 (*)    All other components within normal limits  URINALYSIS, ROUTINE W REFLEX MICROSCOPIC - Abnormal; Notable for the following components:   Hgb urine dipstick MODERATE (*)    All other components within normal limits  SARS CORONAVIRUS 2 (TAT 6-24 HRS)  BASIC METABOLIC PANEL    EKG None  Radiology DG Chest 2 View  Result Date: 11/14/2019 CLINICAL DATA:  Cough, fatigue, weakness. Nausea, vomiting, diarrhea EXAM: CHEST - 2 VIEW COMPARISON:  02/28/2019 FINDINGS: Heart and mediastinal contours are within normal limits. No focal opacities or effusions. No acute bony abnormality. IMPRESSION: No active cardiopulmonary disease. Electronically Signed   By: Charlett Nose M.D.   On: 11/14/2019 13:41    Procedures Procedures (including critical care time)  Medications Ordered in ED Medications  ondansetron (ZOFRAN) tablet 4 mg (4 mg Oral Given 11/14/19 2113)    ED Course  I have reviewed the triage vital signs and the nursing notes.  Pertinent labs & imaging results that were available during my care of the patient were reviewed by me and considered in my medical decision making (see chart for details).    MDM Rules/Calculators/A&P                      MDM  Screen complete  Christina Booth was evaluated in Emergency Department on 11/14/2019 for the symptoms described in the history of present illness. She was evaluated in the context of the global COVID-19 pandemic, which necessitated consideration that the patient might be at risk for infection with the  SARS-CoV-2 virus that causes COVID-19. Institutional protocols and algorithms that pertain to the evaluation of patients at risk for COVID-19 are in a state of rapid change based on information released by regulatory bodies including the CDC and federal and state organizations. These policies and algorithms were followed during the patient's care in the ED.  Patient is presenting for nausea and vomiting evaluation.  Patient with multiple suspected Covid contacts.  Patient without respiratory distress or difficulty.  She appears to be appropriate for discharge.  Importance of close follow-up is stressed. Covid testing initiated from the ED.   Strict return precautions given and understood.    Final Clinical Impression(s) / ED Diagnoses Final  diagnoses:  Nausea and vomiting, intractability of vomiting not specified, unspecified vomiting type    Rx / DC Orders ED Discharge Orders         Ordered    ondansetron (ZOFRAN) 4 MG tablet  Every 6 hours     11/14/19 2126           Valarie Merino, MD 11/14/19 2128

## 2019-11-14 NOTE — Discharge Instructions (Signed)
Please return for any problem. Follow up with your regular care provider as instructed.   Covid test obtained tonight should result in the next 24 hours.

## 2019-11-15 LAB — SARS CORONAVIRUS 2 (TAT 6-24 HRS): SARS Coronavirus 2: POSITIVE — AB

## 2019-11-17 ENCOUNTER — Telehealth: Payer: Self-pay | Admitting: Nurse Practitioner

## 2019-11-17 NOTE — Telephone Encounter (Signed)
Called to Discuss with patient about Covid symptoms and the use of bamlanivimab, a monoclonal antibody infusion for those with mild to moderate Covid symptoms and at a high risk of hospitalization.     Pt is qualified for this infusion at the [facility] infusion center due to co-morbid conditions and/or a member of an at-risk group.    Patient's BMI is 45.52.   Unable to reach pt

## 2019-11-23 ENCOUNTER — Other Ambulatory Visit: Payer: Self-pay

## 2019-11-23 ENCOUNTER — Ambulatory Visit: Payer: BC Managed Care – PPO | Admitting: Family Medicine

## 2019-11-23 ENCOUNTER — Encounter: Payer: Self-pay | Admitting: Family Medicine

## 2019-11-23 VITALS — Temp 99.0°F | Wt 260.0 lb

## 2019-11-23 DIAGNOSIS — U071 COVID-19: Secondary | ICD-10-CM | POA: Diagnosis not present

## 2019-11-23 NOTE — Progress Notes (Signed)
   Subjective:  Documentation for virtual audio and video telecommunications through [facility] encounter:  The patient was located at home. 2 patient identifiers used.  The provider was located in the office. The patient did consent to this visit and is aware of possible charges through their insurance for this visit.  The other persons participating in this telemedicine service were none.    Patient ID: Christina Booth, female    DOB: 06/10/1976, 43 y.o.   MRN: [id number]  HPI Chief Complaint  Patient presents with  . covid positive    covid positive dec 14th. symptoms- weakness, fever- goes up to 101 at night. bad cough, chest tightness, havign some sob with walking   Complains of illness since 11/10/2019.  Positive Covid-19 test on 11/14/2019.    States she continues having Intermittent fever. Temperature last night up to 101.   She also reports a dry cough that is painful to her chest and back. Also reports nausea and diarrhea. Denies abdominal pain or vomiting.   States she cannot taste or smell anything. She thinks she is hydrated, urine is straw colored. Eating mostly soup and tea.  Taking Mucinex and Tylenol.   At night she has worsening shortness of breath. Cold air helps this.   Does not feel so sick that she needs to go to the urgent care or ED.    Review of Systems Pertinent positives and negatives in the history of present illness.     Objective:   Physical Exam Temp 99 F (37.2 C)   Wt 260 lb (117.9 kg)   LMP 10/24/2019   BMI 41.97 kg/m   Alert and in no acute distress. Speaking in complete sentences. Normal mood and thought process.       Assessment & Plan:  COVID-19 virus infection  Encouraged symptomatic treatment, staying well hydrated. Discussed that she should be fever free without medication for at least 48 hours and her respiratory symptoms should be significantly improved before returning to work. Strict precautions that if she has  any worsening shortness of breath, cannot keep fluids down or is getting worse in any way that she go for an evaluation at an UC, ED or respiratory clinic.   Time spent on call was 12 minutes and in review of previous records 15 minutes total.  This virtual service is not related to other E/M service within previous 7 days.

## 2020-01-03 ENCOUNTER — Ambulatory Visit: Payer: BC Managed Care – PPO | Admitting: Family Medicine

## 2020-01-03 ENCOUNTER — Ambulatory Visit
Admission: RE | Admit: 2020-01-03 | Discharge: 2020-01-03 | Disposition: A | Discharge status: Home / Self Care | Payer: BC Managed Care – PPO | Source: Ambulatory Visit | Attending: Family Medicine | Admitting: Family Medicine

## 2020-01-03 ENCOUNTER — Other Ambulatory Visit: Payer: Self-pay

## 2020-01-03 ENCOUNTER — Encounter: Payer: Self-pay | Admitting: Family Medicine

## 2020-01-03 VITALS — BP 120/78 | HR 70 | Temp 97.5°F | Wt 278.4 lb

## 2020-01-03 DIAGNOSIS — M25512 Pain in left shoulder: Secondary | ICD-10-CM | POA: Diagnosis not present

## 2020-01-03 DIAGNOSIS — R0601 Orthopnea: Secondary | ICD-10-CM | POA: Diagnosis not present

## 2020-01-03 DIAGNOSIS — Z8616 Personal history of COVID-19: Secondary | ICD-10-CM | POA: Insufficient documentation

## 2020-01-03 DIAGNOSIS — R0602 Shortness of breath: Secondary | ICD-10-CM | POA: Insufficient documentation

## 2020-01-03 DIAGNOSIS — R0789 Other chest pain: Secondary | ICD-10-CM

## 2020-01-03 DIAGNOSIS — R079 Chest pain, unspecified: Secondary | ICD-10-CM | POA: Diagnosis not present

## 2020-01-03 DIAGNOSIS — R9431 Abnormal electrocardiogram [ECG] [EKG]: Secondary | ICD-10-CM

## 2020-01-03 DIAGNOSIS — G8929 Other chronic pain: Secondary | ICD-10-CM

## 2020-01-03 DIAGNOSIS — D649 Anemia, unspecified: Secondary | ICD-10-CM | POA: Diagnosis not present

## 2020-01-03 MED ORDER — DICLOFENAC SODIUM 75 MG PO TBEC: 75.0000 mg | DELAYED_RELEASE_TABLET | Freq: Two times a day (BID) | ORAL | 0 refills | Status: DC

## 2020-01-03 NOTE — Progress Notes (Signed)
Subjective:    Patient ID: Christina Booth, female    DOB: 10/03/1976, 44 y.o.   MRN: [id number]  HPI Chief Complaint  Patient presents with  . left shoulder pain    left shoulder pain. for about 6 months but pt is starting to get worse    She is a right handed 44 year old female here with complaints of gradually worsening left anterior shoulder pain for the past 6 months. No known injury. No history of similar pain or surgery on her shoulder.   States pain is a throbbing sensation and occasionally radiates into her neck. Certain movements make her pain worse and nothing seems to relieve her pain.  States the pain is now interfering with her ADLs and she has not been able to lay on her left side for the past 2 months.  Reports increasing weakness in her left arm.  Denies swelling, numbness, tingling.   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   Has been using icy hot. Heating pads. Has been taking Aleve, 6 per day. lately switched to Tyelenol 1,500 to 2,000 mg per day.   Does not have orthopedist.   At the end of the visit she also mentions having intermittent sharp left sided chest pain and exertional chest pain since having Covid-19 in December.  States she wakes up at night feeling short of breath and her heart races.  States she cannot lay flat because she feels like she is smothering.   Per my notes- we have discussed that she may have sleep apnea and I ordered a sleep study in January 2020 but this did not get done.   Denies fever, chills, dizziness, abdominal pain, N/V/D, urinary symptoms, LE edema.   Reviewed allergies, medications, past medical, surgical, family, and social history.   LMP: 3 weeks ago  Tubal ligation.    Review of Systems Pertinent positives and negatives in the history of present illness.     Objective:   Physical Exam BP 120/78   Pulse 70   Temp (!) 97.5 F (36.4 C)   Wt 278 lb 6.4 oz  (126.3 kg)   BMI 44.93 kg/m   Alert and oriented and in no distress.  Neck is supple without adenopathy or thyromegaly. Cardiac exam shows a regular rhythm without murmurs or gallops.  Chest wall tenderness with palpation on the left upper sternal border. lungs are clear to auscultation.  Respirations unlabored.  Left shoulder without obvious deformity, tenderness to palpation anteriorly with limited range of motion in pain with extension internal and external rotation and weakness noted with empty can test and resistance.  Left upper extremity is neurovascularly intact.  Skin is warm and dry.  PERRLA, EOMs intact, normal conjunctiva.       Assessment & Plan:  Short of breath on exertion - Plan: CBC with Differential/Platelet, Comprehensive metabolic panel, EKG 12-Lead, DG Chest 2 View, Brain natriuretic peptide, Ambulatory referral to Cardiology  Intermittent left-sided chest pain - Plan: CBC with Differential/Platelet, Comprehensive metabolic panel, EKG 12-Lead, DG Chest 2 View, Brain natriuretic peptide, Ambulatory referral to Cardiology  Chronic left shoulder pain - Plan: DG Shoulder Left, diclofenac (VOLTAREN) 75 MG EC tablet, Ambulatory referral to Orthopedic Surgery  History of COVID-19 - Plan: DG Chest 2 View, SAR CoV2 Serology (COVID 19)AB(IGG)IA  Orthopnea - Plan: EKG 12-Lead, DG Chest 2 View, Brain natriuretic peptide, Ambulatory referral to Cardiology  Abnormal ECG - Plan: Ambulatory  referral to Cardiology  EKG shows NSR and rate of 62. Abnormal with LAD. Questionable age undetermined septal infarct. Read by myself and Dr. Redmond School  No red flag symptoms. Non exertional chest pain.  Will send her for a chest XR and check labs including a BNP.  Refer to cardiology for further evaluation.   Strongly encouraged getting the sleep study I ordered last year. Discussed long term health consequences associated with untreated sleep apnea. Recommend she avoid taking sedating medications  for now.   She will stop OTC NSAIDs and try diclofenac. Continue with ice or heat and topical analgesic. Shoulder XR ordered. Discussed PT referral and she prefers to see an orthopedist.  Follow up pending results.   Overdue for fasting CPE

## 2020-01-04 LAB — COMPREHENSIVE METABOLIC PANEL
ALT: 8 IU/L (ref 0–32)
AST: 14 IU/L (ref 0–40)
Albumin/Globulin Ratio: 1.3 (ref 1.2–2.2)
Albumin: 3.9 g/dL (ref 3.8–4.8)
Alkaline Phosphatase: 67 IU/L (ref 39–117)
BUN/Creatinine Ratio: 14 (ref 9–23)
BUN: 11 mg/dL (ref 6–24)
Bilirubin Total: 0.3 mg/dL (ref 0.0–1.2)
CO2: 24 mmol/L (ref 20–29)
Calcium: 9.7 mg/dL (ref 8.7–10.2)
Chloride: 101 mmol/L (ref 96–106)
Creatinine, Ser: 0.81 mg/dL (ref 0.57–1.00)
GFR calc Af Amer: 104 mL/min/{1.73_m2} (ref >59)
GFR calc non Af Amer: 90 mL/min/{1.73_m2} (ref >59)
Globulin, Total: 3 g/dL (ref 1.5–4.5)
Glucose: 99 mg/dL (ref 65–99)
Potassium: 4.3 mmol/L (ref 3.5–5.2)
Sodium: 137 mmol/L (ref 134–144)
Total Protein: 6.9 g/dL (ref 6.0–8.5)

## 2020-01-04 LAB — CBC WITH DIFFERENTIAL/PLATELET
Basophils Absolute: 0 10*3/uL (ref 0.0–0.2)
Basos: 0 %
EOS (ABSOLUTE): 0.1 10*3/uL (ref 0.0–0.4)
Eos: 2 %
Hematocrit: 32.9 % — ABNORMAL LOW (ref 34.0–46.6)
Hemoglobin: 10.7 g/dL — ABNORMAL LOW (ref 11.1–15.9)
Immature Grans (Abs): 0 10*3/uL (ref 0.0–0.1)
Immature Granulocytes: 0 %
Lymphocytes Absolute: 2.8 10*3/uL (ref 0.7–3.1)
Lymphs: 43 %
MCH: 27 pg (ref 26.6–33.0)
MCHC: 32.5 g/dL (ref 31.5–35.7)
MCV: 83 fL (ref 79–97)
Monocytes Absolute: 0.6 10*3/uL (ref 0.1–0.9)
Monocytes: 9 %
Neutrophils Absolute: 3.1 10*3/uL (ref 1.4–7.0)
Neutrophils: 46 %
Platelets: 334 10*3/uL (ref 150–450)
RBC: 3.96 x10E6/uL (ref 3.77–5.28)
RDW: 16 % — ABNORMAL HIGH (ref 11.7–15.4)
WBC: 6.6 10*3/uL (ref 3.4–10.8)

## 2020-01-04 LAB — SAR COV2 SEROLOGY (COVID19)AB(IGG),IA: DiaSorin SARS-CoV-2 Ab, IgG: POSITIVE — AB

## 2020-01-04 LAB — BRAIN NATRIURETIC PEPTIDE: BNP: 4.8 pg/mL (ref 0.0–100.0)

## 2020-01-05 ENCOUNTER — Encounter: Payer: Self-pay | Admitting: Orthopaedic Surgery

## 2020-01-05 ENCOUNTER — Ambulatory Visit (INDEPENDENT_AMBULATORY_CARE_PROVIDER_SITE_OTHER): Payer: BC Managed Care – PPO | Admitting: Orthopaedic Surgery

## 2020-01-05 ENCOUNTER — Other Ambulatory Visit: Payer: Self-pay

## 2020-01-05 VITALS — Ht 65.0 in | Wt 278.0 lb

## 2020-01-05 DIAGNOSIS — M25512 Pain in left shoulder: Secondary | ICD-10-CM | POA: Diagnosis not present

## 2020-01-05 DIAGNOSIS — G8929 Other chronic pain: Secondary | ICD-10-CM

## 2020-01-05 LAB — SPECIMEN STATUS REPORT

## 2020-01-05 LAB — IRON AND TIBC
Iron Saturation: 13 % — ABNORMAL LOW (ref 15–55)
Iron: 46 ug/dL (ref 27–159)
Total Iron Binding Capacity: 352 ug/dL (ref 250–450)
UIBC: 306 ug/dL (ref 131–425)

## 2020-01-05 LAB — FERRITIN: Ferritin: 12 ng/mL — ABNORMAL LOW (ref 15–150)

## 2020-01-05 MED ORDER — LIDOCAINE HCL 2 % IJ SOLN
2.0000 mL | INTRAMUSCULAR | Status: AC | PRN
  Administered 2020-01-05: 12:00:00 2 mL

## 2020-01-05 MED ORDER — BUPIVACAINE HCL 0.5 % IJ SOLN
2.0000 mL | INTRAMUSCULAR | Status: AC | PRN
  Administered 2020-01-05: 12:00:00 2 mL via INTRA_ARTICULAR

## 2020-01-05 MED ORDER — METHYLPREDNISOLONE ACETATE 40 MG/ML IJ SUSP
80.0000 mg | INTRAMUSCULAR | Status: AC | PRN
  Administered 2020-01-05: 12:00:00 80 mg via INTRA_ARTICULAR

## 2020-01-05 NOTE — Progress Notes (Signed)
Office Visit Note   Patient: Christina Booth           Date of Birth: 1976-09-13           MRN: [id number] Visit Date: 01/05/2020              Requested by: Christina Shackleton, NP-C [address] [place],  [place] [address] PCP: Christina Shackleton, NP-C   Assessment & Plan: Visit Diagnoses:  1. Chronic left shoulder pain     Plan: Impingement syndrome left shoulder.  There certainly is a possibility of rotator cuff tear.  I will inject the subacromial space with cortisone and monitor response over the next several weeks.  If no improvement would consider an MRI scan.  Follow-Up Instructions: Return if symptoms worsen or fail to improve.   Orders:  Orders Placed This Encounter  Procedures  . Large Joint Inj: L subacromial bursa   No orders of the defined types were placed in this encounter.     Procedures: Large Joint Inj: L subacromial bursa on 01/05/2020 11:36 AM Indications: pain and diagnostic evaluation Details: 25 G 1.5 in needle, anterolateral approach  Arthrogram: No  Medications: 2 mL lidocaine 2 %; 2 mL bupivacaine 0.5 %; 80 mg methylPREDNISolone acetate 40 MG/ML Consent was given by the patient. Immediately prior to procedure a time out was called to verify the correct patient, procedure, equipment, support staff and site/side marked as required. Patient was prepped and draped in the usual sterile fashion.       Clinical Data: No additional findings.   Subjective: Chief Complaint  Patient presents with  . Left Shoulder - Pain  Patient presents today for left shoulder pain. No known injury. Patient states that it has been hurting for six months and has progressively worsened. She is taking tylenol for pain. The pain is located on the superior aspect of her shoulder and radiates down if she tries to lift anything. Limited range of motion due to pain. Her pain is worse at night. Her left arm feels weak. She has occasional numbness and tingling. No  previous shoulder surgery. She is right hand dominant. She saw her PCP two days ago and had x-rays taken at [facility].  These were reviewed without evidence of any acute changes or arthritis.  No ectopic calcification.  The humeral head was centered about the glenoid.  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  Eyes:     Pupils: Pupils are equal, round, and reactive to light.  Pulmonary:     Effort: Pulmonary effort is normal.  Skin:    General: Skin is warm and dry.  Neurological:     Mental Status: She is alert and oriented to person, place, and time.  Psychiatric:        Behavior: Behavior normal.     Ortho Exam awake alert and oriented x3.  Comfortable sitting.  Has positive impingement and minimally positive empty can testing left shoulder.  No crepitation.  Able to place her arm fully overhead with slightly circuitous motion.  Good strength.  Biceps appears to be intact.  Does have some pain along the anterior subacromial region.  None at the [facility] joint.  No neck pain or referred discomfort to left shoulder with motion of her neck  Specialty Comments:  No specialty comments available.  Imaging: No results found.   PMFS History: Patient Active Problem List   Diagnosis Date Noted  . Short of breath on exertion 01/03/2020  . Intermittent left-sided chest pain 01/03/2020  . Chronic left shoulder pain 01/03/2020  . History of COVID-19 01/03/2020  . Orthopnea 01/03/2020  . Prediabetes 12/30/2018  . Vitamin D deficiency 12/30/2018  . Morbid obesity ([place]) 12/29/2018  . Anemia 12/29/2018  . Excessive daytime sleepiness 12/29/2018  . Osteoarthritis 07/09/2018   Past Medical History:  Diagnosis Date  . Back  pain   . Morbid obesity ([place]) 12/29/2018  . Osteoarthritis 07/09/2018  . Prediabetes 12/30/2018  . Vitamin D deficiency 12/30/2018    History reviewed. No pertinent family history.  Past Surgical History:  Procedure Laterality Date  . btl    . CHOLECYSTECTOMY    . UPPER GI ENDOSCOPY  04/14/2018   normal   Social History   Occupational History  . Not on file  Tobacco Use  . Smoking status: Never Smoker  . Smokeless tobacco: Never Used  Substance and Sexual Activity  . Alcohol use: No  . Drug use: No  . Sexual activity: Yes    Birth control/protection: Surgical

## 2020-01-08 NOTE — Progress Notes (Deleted)
Cardiology Office Note:    Date:  01/08/2020   ID:  Christina Booth, DOB September 12, 1976, MRN [id number]  PCP:  Avanell Shackleton, NP-C  Cardiologist:  No primary care provider on file.  Electrophysiologist:  None   Referring MD: Avanell Shackleton, NP-C   Chief Complaint: chest pain, DOE after covid 19 infection  History of Present Illness:    Christina Booth is a 44 y.o. female with a history of ***  Per PC-NP At the end of the visit she also mentions having intermittent sharp left sided chest pain and exertional chest pain since having Covid-19 in December.  States she wakes up at night feeling short of breath and her heart races.  States she cannot lay flat because she feels like she is smothering.   Per my notes- we have discussed that she may have sleep apnea and I ordered a sleep study in January 2020 but this did not get done.   Past Medical History:  Diagnosis Date  . Back pain   . Morbid obesity (HCC) 12/29/2018  . Osteoarthritis 07/09/2018  . Prediabetes 12/30/2018  . Vitamin D deficiency 12/30/2018    Past Surgical History:  Procedure Laterality Date  . btl    . CHOLECYSTECTOMY    . UPPER GI ENDOSCOPY  04/14/2018   normal    Current Medications: No outpatient medications have been marked as taking for the 01/09/20 encounter (Appointment) with Parke Poisson, MD.     Allergies:   Patient has no known allergies.   Social History   Socioeconomic History  . Marital status: Married    Spouse name: Not on file  . Number of children: Not on file  . Years of education: Not on file  . Highest education level: Not on file  Occupational History  . Not on file  Tobacco Use  . Smoking status: Never Smoker  . Smokeless tobacco: Never Used  Substance and Sexual Activity  . Alcohol use: No  . Drug use: No  . Sexual activity: Yes    Birth control/protection: Surgical  Other Topics Concern  . Not on file  Social History Narrative  . Not on file    Social Determinants of Health   Financial Resource Strain:   . Difficulty of Paying Living Expenses: Not on file  Food Insecurity:   . Worried About Programme researcher, broadcasting/film/video in the Last Year: Not on file  . Ran Out of Food in the Last Year: Not on file  Transportation Needs:   . Lack of Transportation (Medical): Not on file  . Lack of Transportation (Non-Medical): Not on file  Physical Activity:   . Days of Exercise per Week: Not on file  . Minutes of Exercise per Session: Not on file  Stress:   . Feeling of Stress : Not on file  Social Connections:   . Frequency of Communication with Friends and Family: Not on file  . Frequency of Social Gatherings with Friends and Family: Not on file  . Attends Religious Services: Not on file  . Active Member of Clubs or Organizations: Not on file  . Attends Banker Meetings: Not on file  . Marital Status: Not on file     Family History: The patient's family history is not on file.  ROS:   Please see the history of present illness.    All other systems reviewed and are negative.  EKGs/Labs/Other Studies Reviewed:    The following studies were reviewed  today:  EKG:  ***  I have independently reviewed the images from Chest Xray/CTA chest/CT chest dated ***.  Recent Labs: 02/28/2019: TSH 3.888 01/03/2020: ALT 8; BNP 4.8; BUN 11; Creatinine, Ser 0.81; Hemoglobin 10.7; Platelets 334; Potassium 4.3; Sodium 137  Recent Lipid Panel No results found for: CHOL, TRIG, HDL, CHOLHDL, VLDL, LDLCALC, LDLDIRECT  Physical Exam:    VS:  There were no vitals taken for this visit.    Wt Readings from Last 5 Encounters:  01/05/20 278 lb (126.1 kg)  01/03/20 278 lb 6.4 oz (126.3 kg)  11/23/19 260 lb (117.9 kg)  11/14/19 282 lb (127.9 kg)  02/28/19 275 lb (124.7 kg)     Constitutional: No acute distress Eyes: sclera non-icteric, normal conjunctiva and lids ENMT: normal dentition, moist mucous membranes Cardiovascular: regular rhythm,  normal rate, no murmurs. S1 and S2 normal. Radial pulses normal bilaterally. No jugular venous distention.  Respiratory: clear to auscultation bilaterally GI : normal bowel sounds, soft and nontender. No distention.   MSK: extremities warm, well perfused. No edema.  NEURO: grossly nonfocal exam, moves all extremities. PSYCH: alert and oriented x 3, normal mood and affect.      ASSESSMENT:    No diagnosis found. PLAN:     Total time of encounter: *** minutes total time of encounter, including *** minutes spent in face-to-face patient care. This time includes coordination of care and counseling regarding above mentioned problem list. Remainder of non-face-to-face time involved reviewing chart documents/testing relevant to the patient encounter and documentation in the medical record. I have independently reviewed documentation from referring provider.   Cherlynn Kaiser, MD [place]  [facility]    Medication Adjustments/Labs and Tests Ordered: Current medicines are reviewed at length with the patient today.  Concerns regarding medicines are outlined above.  No orders of the defined types were placed in this encounter.  No orders of the defined types were placed in this encounter.   There are no Patient Instructions on file for this visit.

## 2020-01-09 ENCOUNTER — Ambulatory Visit: Payer: BC Managed Care – PPO | Admitting: Internal Medicine

## 2020-01-11 NOTE — Progress Notes (Deleted)
Cardiology Office Note:   Date:  01/11/2020  NAME:  Christina Booth    MRN: [id number] DOB:  [birth year]   PCP:  Christina Rm, NP-C  Cardiologist:  No primary care provider on file.  Electrophysiologist:  None   Referring MD: Christina Rm, NP-C   No chief complaint on file. ***  History of Present Illness:   Christina Booth is a 44 y.o. female with a hx of morbid obesity, arthritis, prediabetes who is being seen today for the evaluation of shortness of breath/chest pain at the request of Henson, Vickie L, NP-C.  She was evaluated by her primary care physician on 01/03/2020 for shortness of breath and chest pain.  Chest pain is atypical and more consistent with arm pain that radiates into her neck.  Laboratory data showed BNP 4.8 and anemia hemoglobin 10.7.  Her EKG on my review demonstrated normal sinus rhythm with no acute abnormalities.  She is also evaluated by orthopedics on 01/05/2020 and they have concerns for impingement syndrome of the left shoulder.  Past Medical History: Past Medical History:  Diagnosis Date  . Back pain   . Morbid obesity ([place]) 12/29/2018  . Osteoarthritis 07/09/2018  . Prediabetes 12/30/2018  . Vitamin D deficiency 12/30/2018    Past Surgical History: Past Surgical History:  Procedure Laterality Date  . btl    . CHOLECYSTECTOMY    . UPPER GI ENDOSCOPY  04/14/2018   normal    Current Medications: No outpatient medications have been marked as taking for the 01/13/20 encounter (Appointment) with O'Neal, Cassie Freer, MD.     Allergies:    Patient has no known allergies.   Social History: Social History   Socioeconomic History  . Marital status: Married    Spouse name: Not on file  . Number of children: Not on file  . Years of education: Not on file  . Highest education level: Not on file  Occupational History  . Not on file  Tobacco Use  . Smoking status: Never Smoker  . Smokeless tobacco: Never Used  Substance and  Sexual Activity  . Alcohol use: No  . Drug use: No  . Sexual activity: Yes    Birth control/protection: Surgical  Other Topics Concern  . Not on file  Social History Narrative  . Not on file   Social Determinants of Health   Financial Resource Strain:   . Difficulty of Paying Living Expenses: Not on file  Food Insecurity:   . Worried About Charity fundraiser in the Last Year: Not on file  . Ran Out of Food in the Last Year: Not on file  Transportation Needs:   . Lack of Transportation (Medical): Not on file  . Lack of Transportation (Non-Medical): Not on file  Physical Activity:   . Days of Exercise per Week: Not on file  . Minutes of Exercise per Session: Not on file  Stress:   . Feeling of Stress : Not on file  Social Connections:   . Frequency of Communication with Friends and Family: Not on file  . Frequency of Social Gatherings with Friends and Family: Not on file  . Attends Religious Services: Not on file  . Active Member of Clubs or Organizations: Not on file  . Attends Archivist Meetings: Not on file  . Marital Status: Not on file     Family History: The patient's ***family history is not on file.  ROS:   All other ROS reviewed and  negative. Pertinent positives noted in the HPI.     EKGs/Labs/Other Studies Reviewed:   The following studies were personally reviewed by me today:  EKG:  EKG is *** ordered today.  The ekg ordered today demonstrates ***, and was personally reviewed by me.   Recent Labs: 02/28/2019: TSH 3.888 01/03/2020: ALT 8; BNP 4.8; BUN 11; Creatinine, Ser 0.81; Hemoglobin 10.7; Platelets 334; Potassium 4.3; Sodium 137   Recent Lipid Panel No results found for: CHOL, TRIG, HDL, CHOLHDL, VLDL, LDLCALC, LDLDIRECT  Physical Exam:   VS:  There were no vitals taken for this visit.   Wt Readings from Last 3 Encounters:  01/05/20 278 lb (126.1 kg)  01/03/20 278 lb 6.4 oz (126.3 kg)  11/23/19 260 lb (117.9 kg)    General: Well  nourished, well developed, in no acute distress Heart: Atraumatic, normal size  Eyes: PEERLA, EOMI  Neck: Supple, no JVD Endocrine: No thryomegaly Cardiac: Normal S1, S2; RRR; no murmurs, rubs, or gallops Lungs: Clear to auscultation bilaterally, no wheezing, rhonchi or rales  Abd: Soft, nontender, no hepatomegaly  Ext: No edema, pulses 2+ Musculoskeletal: No deformities, BUE and BLE strength normal and equal Skin: Warm and dry, no rashes   Neuro: Alert and oriented to person, place, time, and situation, CNII-XII grossly intact, no focal deficits  Psych: Normal mood and affect   ASSESSMENT:   [name] is a 44 y.o. female who presents for the following: No diagnosis found.  PLAN:   There are no diagnoses linked to this encounter.  Disposition: No follow-ups on file.  Medication Adjustments/Labs and Tests Ordered: Current medicines are reviewed at length with the patient today.  Concerns regarding medicines are outlined above.  No orders of the defined types were placed in this encounter.  No orders of the defined types were placed in this encounter.   There are no Patient Instructions on file for this visit.   Signed, Lenna Gilford. Flora Lipps, MD [facility]  [address] [place], [place] [address] [phone]  01/11/2020 7:46 PM

## 2020-01-13 ENCOUNTER — Ambulatory Visit: Payer: BC Managed Care – PPO | Admitting: Cardiovascular Disease

## 2020-01-17 ENCOUNTER — Encounter: Payer: Self-pay | Admitting: Family Medicine

## 2020-02-16 NOTE — Progress Notes (Deleted)
   Subjective:    Patient ID: Christina Booth, female    DOB: 1976-07-10, 44 y.o.   MRN: [id number]  HPI No chief complaint on file.  She is here for a complete physical exam. Previous medical care: Last CPE:  Other providers:  Past medical history: Surgeries:  Family history: Mental Health History:  Social history: Lives with ***, works as ***, *** Smoking, drinking alcohol, drug use  Diet: *** Excerise: ***  Immunizations:  Health maintenance:  Mammogram: Colonoscopy: Last Gynecological Exam: Last Menstrual cycle: Pregnancies:  Last Dental Exam: Last Eye Exam:  Wears seatbelt always, uses sunscreen, smoke detectors in home and functioning, does not text while driving and feels safe in home environment.   Reviewed allergies, medications, past medical, surgical, family, and social history.   Review of Systems Review of Systems Constitutional: -fever, -chills, -sweats, -unexpected weight change,-fatigue ENT: -runny nose, -ear pain, -sore throat Cardiology:  -chest pain, -palpitations, -edema Respiratory: -cough, -shortness of breath, -wheezing Gastroenterology: -abdominal pain, -nausea, -vomiting, -diarrhea, -constipation  Hematology: -bleeding or bruising problems Musculoskeletal: -arthralgias, -myalgias, -joint swelling, -back pain Ophthalmology: -vision changes Urology: -dysuria, -difficulty urinating, -hematuria, -urinary frequency, -urgency Neurology: -headache, -weakness, -tingling, -numbness       Objective:   Physical Exam There were no vitals taken for this visit.  General Appearance:    Alert, cooperative, no distress, appears stated age  Head:    Normocephalic, without obvious abnormality, atraumatic  Eyes:    PERRL, conjunctiva/corneas clear, EOM's intact, fundi    benign  Ears:    Normal TM's and external ear canals  Nose:   Nares normal, mucosa normal, no drainage or sinus   tenderness  Throat:   Lips, mucosa, and tongue normal;  teeth and gums normal  Neck:   Supple, no lymphadenopathy;  thyroid:  no   enlargement/tenderness/nodules; no carotid   bruit or JVD  Back:    Spine nontender, no curvature, ROM normal, no CVA     tenderness  Lungs:     Clear to auscultation bilaterally without wheezes, rales or     ronchi; respirations unlabored  Chest Wall:    No tenderness or deformity   Heart:    Regular rate and rhythm, S1 and S2 normal, no murmur, rub   or gallop  Breast Exam:    No tenderness, masses, or nipple discharge or inversion.      No axillary lymphadenopathy  Abdomen:     Soft, non-tender, nondistended, normoactive bowel sounds,    no masses, no hepatosplenomegaly  Genitalia:    Normal external genitalia without lesions.  BUS and vagina normal; cervix without lesions, or cervical motion tenderness. No abnormal vaginal discharge.  Uterus and adnexa not enlarged, nontender, no masses.  Pap performed  Rectal:    Normal tone, no masses or tenderness; guaiac negative stool  Extremities:   No clubbing, cyanosis or edema  Pulses:   2+ and symmetric all extremities  Skin:   Skin color, texture, turgor normal, no rashes or lesions  Lymph nodes:   Cervical, supraclavicular, and axillary nodes normal  Neurologic:   CNII-XII intact, normal strength, sensation and gait; reflexes 2+ and symmetric throughout          Psych:   Normal mood, affect, hygiene and grooming.         Assessment & Plan:  Routine general medical examination at a health care facility

## 2020-02-17 ENCOUNTER — Encounter: Payer: BC Managed Care – PPO | Admitting: Family Medicine

## 2020-02-21 ENCOUNTER — Encounter: Payer: Self-pay | Admitting: Family Medicine

## 2020-03-05 ENCOUNTER — Ambulatory Visit: Payer: 59 | Attending: Internal Medicine

## 2020-03-05 DIAGNOSIS — Z20822 Contact with and (suspected) exposure to covid-19: Secondary | ICD-10-CM | POA: Insufficient documentation

## 2020-03-07 LAB — SARS-COV-2, NAA 2 DAY TAT

## 2020-03-07 LAB — NOVEL CORONAVIRUS, NAA: SARS-CoV-2, NAA: NOT DETECTED

## 2020-03-14 ENCOUNTER — Ambulatory Visit: Payer: Self-pay | Admitting: Orthopaedic Surgery

## 2020-10-02 ENCOUNTER — Other Ambulatory Visit: Payer: Self-pay

## 2020-10-02 DIAGNOSIS — Z20822 Contact with and (suspected) exposure to covid-19: Secondary | ICD-10-CM

## 2020-10-03 LAB — SARS-COV-2, NAA 2 DAY TAT

## 2020-10-03 LAB — NOVEL CORONAVIRUS, NAA: SARS-CoV-2, NAA: NOT DETECTED

## 2020-11-27 ENCOUNTER — Encounter: Payer: Self-pay | Admitting: Orthopedic Surgery

## 2020-11-27 ENCOUNTER — Ambulatory Visit: Payer: Self-pay

## 2020-11-27 ENCOUNTER — Other Ambulatory Visit: Payer: Self-pay

## 2020-11-27 ENCOUNTER — Telehealth: Payer: Self-pay

## 2020-11-27 ENCOUNTER — Ambulatory Visit (INDEPENDENT_AMBULATORY_CARE_PROVIDER_SITE_OTHER): Payer: 59 | Admitting: Orthopedic Surgery

## 2020-11-27 VITALS — Ht 65.0 in | Wt 260.0 lb

## 2020-11-27 DIAGNOSIS — M542 Cervicalgia: Secondary | ICD-10-CM | POA: Diagnosis not present

## 2020-11-27 DIAGNOSIS — M5412 Radiculopathy, cervical region: Secondary | ICD-10-CM | POA: Diagnosis not present

## 2020-11-27 MED ORDER — METHYLPREDNISOLONE 4 MG PO TBPK: ORAL_TABLET | ORAL | 0 refills | Status: DC

## 2020-11-27 NOTE — Progress Notes (Signed)
Office Visit Note   Patient: Christina Booth           Date of Birth: [birth year]           MRN: [id number] Visit Date: 11/27/2020              Requested by: Avanell Shackleton, NP-C [address] [place],  [place] [address] PCP: Avanell Shackleton, NP-C   Assessment & Plan: Visit Diagnoses:  1. Neck pain   2. Radiculopathy, cervical region     Plan:  #1: Start her on a Medrol Dosepak 6-day #2: We will try to get an MRI scan ASAP #3: Return early if she is not doing well otherwise follow back up after MRI scan #4: She will stop her baby aspirin until she is finished with the Medrol Dosepak.   Follow-Up Instructions: Return for follow up after  MRI scan.   Face-to-face time spent with patient was greater than 30 minutes.  Greater than 50% of the time was spent in counseling and coordination of care.  Orders:  Orders Placed This Encounter  Procedures  . XR Cervical Spine 2 or 3 views  . MR Cervical Spine w/o contrast   Meds ordered this encounter  Medications  . methylPREDNISolone (MEDROL DOSEPAK) 4 MG TBPK tablet    Sig: divided  6 tabs day 1    2-2-2 5 tabs day 2    2-1-2 4 tabs day 3    2-1-1 3 tabs day 4    1-1-1 2 tabs day 5    1-1 1 tab day 6      1    Dispense:  21 tablet    Refill:  0    Order Specific Question:   Supervising Provider    Answer:   Valeria Batman [[id number]]      Procedures: No procedures performed   Clinical Data: No additional findings.   Subjective: Chief Complaint  Patient presents with  . Left Shoulder - Pain   HPI Patient presents today for her left shoulder pain. She was last here in February and had her shoulder injected with cortisone. She states that the injected really helped until it wore off. She has been having a lot of pain in the last 2-35months. She states that she has numbness in her left hand. Decreased range of motion of her cervical spine as well as the left arm.. Weakness in her left arm. She said that  taking over the counter medications has always helped, but now it does not. She is right hand dominant.    Review of Systems   Objective: Vital Signs: Ht 5\' 5"  (1.651 m)   Wt 260 lb (117.9 kg)   BMI 43.27 kg/m   Physical Exam Constitutional:      Appearance: She is well-developed and well-nourished.  HENT:     Head: Normocephalic and atraumatic.     Nose: Nose normal.     Mouth/Throat:     Mouth: Oropharynx is clear and moist. Mucous membranes are moist.  Eyes:     Extraocular Movements: EOM normal.     Pupils: Pupils are equal, round, and reactive to light.  Pulmonary:     Effort: Pulmonary effort is normal.  Skin:    General: Skin is warm and dry.  Neurological:     Mental Status: She is alert and oriented to person, place, and time.  Psychiatric:        Mood and Affect: Mood and affect and  mood normal.        Behavior: Behavior normal.     Ortho Exam  Exam today reveals bilateral weakness diffusely in the knee upper extremities.  She seems to be a little bit stronger on the right than on the left.  Grip strength is decreased on the left in comparison to the right.  Deep tendon reflexes trace at best.  She has marked decreased range of motion of the left shoulder noted also.  Pain with backward extension and forward flexion as well as right and left rotation of the cervical spine.   Specialty Comments:  No specialty comments available.  Imaging: XR Cervical Spine 2 or 3 views  Result Date: 11/27/2020 Three-view x-ray of the cervical spine reveals straightening of the lordotic curve.  Disc spaces appear to be maintained.    PMFS History: Current Outpatient Medications  Medication Sig Dispense Refill  . Ascorbic Acid (VITAMIN C PO) Take 1 tablet by mouth daily.    . citalopram (CELEXA) 20 MG tablet Take 20 mg by mouth daily.    . Cyanocobalamin (VITAMIN B 12) 500 MCG TABS Take 500 mcg by mouth daily.    . diclofenac (VOLTAREN) 75 MG EC tablet Take 1 tablet (75  mg total) by mouth 2 (two) times daily. 30 tablet 0  . lisinopril-hydrochlorothiazide (ZESTORETIC) 10-12.5 MG tablet Take 1 tablet by mouth daily.    . methylPREDNISolone (MEDROL DOSEPAK) 4 MG TBPK tablet divided  6 tabs day 1    2-2-2 5 tabs day 2    2-1-2 4 tabs day 3    2-1-1 3 tabs day 4    1-1-1 2 tabs day 5    1-1 1 tab day 6      1 21  tablet 0   No current facility-administered medications for this visit.    Patient Active Problem List   Diagnosis Date Noted  . Radiculopathy, cervical region 11/27/2020  . Short of breath on exertion 01/03/2020  . Intermittent left-sided chest pain 01/03/2020  . Chronic left shoulder pain 01/03/2020  . History of COVID-19 01/03/2020  . Orthopnea 01/03/2020  . Prediabetes 12/30/2018  . Vitamin D deficiency 12/30/2018  . Morbid obesity (HCC) 12/29/2018  . Anemia 12/29/2018  . Excessive daytime sleepiness 12/29/2018  . Osteoarthritis 07/09/2018   Past Medical History:  Diagnosis Date  . Back pain   . Morbid obesity (HCC) 12/29/2018  . Osteoarthritis 07/09/2018  . Prediabetes 12/30/2018  . Vitamin D deficiency 12/30/2018    History reviewed. No pertinent family history.  Past Surgical History:  Procedure Laterality Date  . btl    . CHOLECYSTECTOMY    . UPPER GI ENDOSCOPY  04/14/2018   normal   Social History   Occupational History  . Not on file  Tobacco Use  . Smoking status: Never Smoker  . Smokeless tobacco: Never Used  Substance and Sexual Activity  . Alcohol use: No  . Drug use: No  . Sexual activity: Yes    Birth control/protection: Surgical

## 2020-11-27 NOTE — Telephone Encounter (Signed)
Patient called about the rx that was sent in today she stated the pharmacy hasn't received the rx. [phone]

## 2020-11-27 NOTE — Telephone Encounter (Signed)
Called pharmacy and then resubmitted. Patient is aware.

## 2020-11-28 ENCOUNTER — Other Ambulatory Visit: Payer: Self-pay

## 2020-11-28 ENCOUNTER — Other Ambulatory Visit: Payer: 59

## 2020-11-28 ENCOUNTER — Ambulatory Visit
Admission: RE | Admit: 2020-11-28 | Discharge: 2020-11-28 | Disposition: A | Discharge status: Home / Self Care | Payer: 59 | Source: Ambulatory Visit | Attending: Orthopedic Surgery | Admitting: Orthopedic Surgery

## 2020-11-28 ENCOUNTER — Telehealth: Payer: Self-pay | Admitting: Radiology

## 2020-11-28 DIAGNOSIS — M4802 Spinal stenosis, cervical region: Secondary | ICD-10-CM | POA: Diagnosis not present

## 2020-11-28 DIAGNOSIS — M542 Cervicalgia: Secondary | ICD-10-CM

## 2020-11-28 NOTE — Telephone Encounter (Signed)
Deirdre Priest has reviewed and discussed with me

## 2020-11-28 NOTE — Telephone Encounter (Signed)
Diane with [facility] called triage line for STAT results on MRI Cervical Spine patient had this morning.  Report is under Imaging Tab in Chart.

## 2020-11-29 ENCOUNTER — Other Ambulatory Visit: Payer: Self-pay

## 2020-11-29 DIAGNOSIS — M542 Cervicalgia: Secondary | ICD-10-CM

## 2020-11-29 DIAGNOSIS — M5412 Radiculopathy, cervical region: Secondary | ICD-10-CM

## 2020-12-06 ENCOUNTER — Telehealth: Payer: Self-pay | Admitting: Orthopaedic Surgery

## 2020-12-06 ENCOUNTER — Other Ambulatory Visit: Payer: Self-pay | Admitting: Orthopaedic Surgery

## 2020-12-06 MED ORDER — HYDROCODONE-ACETAMINOPHEN 5-325 MG PO TABS: 1.0000 | ORAL_TABLET | Freq: Four times a day (QID) | ORAL | 0 refills | Status: DC | PRN

## 2020-12-06 NOTE — Telephone Encounter (Signed)
sent 

## 2020-12-06 NOTE — Telephone Encounter (Signed)
Wait on further  prednisone until seen by neurosurgeon-could try pain med if wanted

## 2020-12-06 NOTE — Telephone Encounter (Signed)
Patient called. She would like a refill on prednisone. Her call back number is [phone]

## 2020-12-06 NOTE — Telephone Encounter (Signed)
I spoke with the pt and she stated understanding to this plan. She was instructed to alternate tylenol and ibu and take the hydrocodone as need. Dr. Cleophas Dunker please send the hydrocodone into the [facility] on [address].

## 2020-12-06 NOTE — Telephone Encounter (Signed)
Last given on 12/28 by brian

## 2020-12-10 ENCOUNTER — Other Ambulatory Visit: Payer: Self-pay | Admitting: Neurosurgery

## 2020-12-10 ENCOUNTER — Ambulatory Visit: Payer: Self-pay | Admitting: Family Medicine

## 2020-12-10 ENCOUNTER — Other Ambulatory Visit (HOSPITAL_COMMUNITY): Payer: Self-pay | Admitting: Neurosurgery

## 2020-12-10 ENCOUNTER — Other Ambulatory Visit: Payer: Self-pay

## 2020-12-10 ENCOUNTER — Encounter: Payer: Self-pay | Admitting: Family Medicine

## 2020-12-10 VITALS — BP 130/82 | HR 71 | Temp 99.0°F | Wt 290.8 lb

## 2020-12-10 DIAGNOSIS — L0291 Cutaneous abscess, unspecified: Secondary | ICD-10-CM

## 2020-12-10 DIAGNOSIS — G935 Compression of brain: Secondary | ICD-10-CM

## 2020-12-10 MED ORDER — IBUPROFEN 800 MG PO TABS: 800.0000 mg | ORAL_TABLET | Freq: Three times a day (TID) | ORAL | 0 refills | Status: DC | PRN

## 2020-12-10 MED ORDER — AMOXICILLIN-POT CLAVULANATE 875-125 MG PO TABS: 1.0000 | ORAL_TABLET | Freq: Two times a day (BID) | ORAL | 0 refills | Status: DC

## 2020-12-10 NOTE — Progress Notes (Signed)
   Subjective:    Patient ID: Christina Booth, female    DOB: Jan 15, 1976, 45 y.o.   MRN: [id number]  HPI Chief Complaint  Patient presents with  . boil under breast    Right breast boil. 4 days ago, 2 boils, some pus coming out. Painful and red   She is here with complaints of an abscess to her right side, below her breast for the past 4 days. Reports history of the same but never this bad. Reports pain, increased warmth, redness and she has noticed some bleeding as well as exudate.  No fever, chills, nausea, vomiting or diarrhea.   Reviewed allergies, medications, past medical, surgical, family, and social history.   Review of Systems Pertinent positives and negatives in the history of present illness.     Objective:   Physical Exam Cardiovascular:     Rate and Rhythm: Normal rate.  Chest:       Comments: Large area of firmness with induration and surrounding erythema beneath her breast on her right chest wall. The area is markedly TTP.  Skin:    General: Skin is warm and dry.    BP 130/82   Pulse 71   Temp 99 F (37.2 C)   Wt 290 lb 12.8 oz (131.9 kg)   BMI 48.39 kg/m        Assessment & Plan:  Abscess - Plan: amoxicillin-clavulanate (AUGMENTIN) 875-125 MG tablet, ibuprofen (ADVIL) 800 MG tablet  Diagnosis: abscess - Location: right chest wall beneath right breast Procedure: Incision & drainage Informed consent:  Discussed risks ( infection, pain, bleeding, bruising, numbness, and recurrence of the condition) and benefits of the procedure, as well as the alternatives.  Informed consent was obtained. Dr. Susann Givens and myself performed the procedure.  Anesthesia: Xylocaine with Epi The area was cleaned with alcohol and then Betadine. Then the area was prepared and draped in a standard fashion. A #10 blade was used to incise.  A large amount of exudate was drained with a foul odor as well as serosanguinous fluid.  The area was then flushed with sterile saline  and pack with 1/4 inch iododiform packing strip.  The patient tolerated the procedure well. The patient was instructed on post-op care. A handout was provided.  Bactrim prescribed.  She will follow up in 2 days or sooner if needed.

## 2020-12-10 NOTE — Patient Instructions (Signed)
Keep the area clean and dry for the next 48 hours.   Take the antibiotic as prescribed until done.   Take 800 mg of ibuprofen 3 times daily for the next 2-3 days.  Take this with food and a full glass of water.   Follow up in 2 days or sooner if needed.      Incision and Drainage, Care After This sheet gives you information about how to care for yourself after your procedure. Your health care provider may also give you more specific instructions. If you have problems or questions, contact your health care provider. What can I expect after the procedure? After the procedure, it is common to have:  Pain or discomfort around the incision site.  Blood, fluid, or pus (drainage) from the incision.  Redness and firm skin around the incision site. Follow these instructions at home: Medicines  Take over-the-counter and prescription medicines only as told by your health care provider.  If you were prescribed an antibiotic medicine, use or take it as told by your health care provider. Do not stop using the antibiotic even if you start to feel better. Wound care Follow instructions from your health care provider about how to take care of your wound. Make sure you:  Wash your hands with soap and water before and after you change your bandage (dressing). If soap and water are not available, use hand sanitizer.  Change your dressing and packing as told by your health care provider. ? If your dressing is dry or stuck when you try to remove it, moisten or wet the dressing with saline or water so that it can be removed without harming your skin or tissues. ? If your wound is packed, leave it in place until your health care provider tells you to remove it. To remove the packing, moisten or wet the packing with saline or water so that it can be removed without harming your skin or tissues.  Leave stitches (sutures), skin glue, or adhesive strips in place. These skin closures may need to stay in  place for 2 weeks or longer. If adhesive strip edges start to loosen and curl up, you may trim the loose edges. Do not remove adhesive strips completely unless your health care provider tells you to do that. Check your wound every day for signs of infection. Check for:  More redness, swelling, or pain.  More fluid or blood.  Warmth.  Pus or a bad smell. If you were sent home with a drain tube in place, follow instructions from your health care provider about:  How to empty it.  How to care for it at home.   General instructions  Rest the affected area.  Do not take baths, swim, or use a hot tub until your health care provider approves. Ask your health care provider if you may take showers. You may only be allowed to take sponge baths.  Return to your normal activities as told by your health care provider. Ask your health care provider what activities are safe for you. Your health care provider may put you on activity or lifting restrictions.  The incision will continue to drain. It is normal to have some clear or slightly bloody drainage. The amount of drainage should lessen each day.  Do not apply any creams, ointments, or liquids unless you have been told to by your health care provider.  Keep all follow-up visits as told by your health care provider. This is important. Contact a health  care provider if:  Your cyst or abscess returns.  You have a fever or chills.  You have more redness, swelling, or pain around your incision.  You have more fluid or blood coming from your incision.  Your incision feels warm to the touch.  You have pus or a bad smell coming from your incision.  You have red streaks above or below the incision site. Get help right away if:  You have severe pain or bleeding.  You cannot eat or drink without vomiting.  You have decreased urine output.  You become short of breath.  You have chest pain.  You cough up blood.  The affected area  becomes numb or starts to tingle. These symptoms may represent a serious problem that is an emergency. Do not wait to see if the symptoms will go away. Get medical help right away. Call your local emergency services (911 in the [place] Do not drive yourself to the hospital. Summary  After this procedure, it is common to have fluid, blood, or pus coming from the surgery site.  Follow all home care instructions. You will be told how to take care of your incision, how to check for infection, and how to take medicines.  If you were prescribed an antibiotic medicine, take it as told by your health care provider. Do not stop taking the antibiotic even if you start to feel better.  Contact a health care provider if you have increased redness, swelling, or pain around your incision. Get help right away if you have chest pain, you vomit, you cough up blood, or you have shortness of breath.  Keep all follow-up visits as told by your health care provider. This is important. This information is not intended to replace advice given to you by your health care provider. Make sure you discuss any questions you have with your health care provider. Document Revised: 10/18/2018 Document Reviewed: 10/18/2018 Elsevier Patient Education  2021 [employer].

## 2020-12-12 ENCOUNTER — Encounter: Payer: Self-pay | Admitting: Family Medicine

## 2020-12-12 ENCOUNTER — Other Ambulatory Visit: Payer: Self-pay

## 2020-12-12 ENCOUNTER — Ambulatory Visit: Payer: 59 | Admitting: Family Medicine

## 2020-12-12 VITALS — BP 138/80 | HR 105 | Temp 98.6°F | Wt 295.4 lb

## 2020-12-12 DIAGNOSIS — L0291 Cutaneous abscess, unspecified: Secondary | ICD-10-CM

## 2020-12-12 NOTE — Progress Notes (Signed)
   Subjective:    Patient ID: Christina Booth, female    DOB: [birth year], 45 y.o.   MRN: [id number]  HPI Chief Complaint  Patient presents with  . Follow-up   She is here for 2-day follow-up on an abscess of her right chest wall.  The area was incised and drained 2 days ago.  The area was packed.  She reports significant improvement in symptoms. No fever, chills, nausea or vomiting.   Review of Systems Pertinent positives and negatives in the history of present illness.     Objective:   Physical Exam BP 138/80   Pulse (!) 105   Temp 98.6 F (37 C)   Wt 295 lb 6.4 oz (134 kg)   SpO2 96%   BMI 49.16 kg/m   Wound bed with pink granulation tissue and no sign of infection.  There is still an area of firmness anterior but it is nontender, no erythema or induration.      Assessment & Plan:  Abscess  Packing removed and no exudate.  The wound bed appears to be healing.  She will complete the antibiotic.  I repacked the area due to the large crevice and she will follow-up in 2 days.  She will see Dr. Susann Givens since I will not be in the office and he did see her and assist with the procedure 2 days ago.

## 2020-12-14 ENCOUNTER — Ambulatory Visit: Payer: 59 | Admitting: Family Medicine

## 2020-12-14 ENCOUNTER — Ambulatory Visit: Payer: 59 | Admitting: Medical

## 2020-12-19 ENCOUNTER — Ambulatory Visit (HOSPITAL_COMMUNITY)
Admission: RE | Admit: 2020-12-19 | Discharge: 2020-12-19 | Disposition: A | Discharge status: Home / Self Care | Payer: 59 | Source: Ambulatory Visit | Attending: Neurosurgery | Admitting: Neurosurgery

## 2020-12-19 ENCOUNTER — Other Ambulatory Visit: Payer: Self-pay

## 2020-12-19 DIAGNOSIS — G935 Compression of brain: Secondary | ICD-10-CM | POA: Insufficient documentation

## 2020-12-28 ENCOUNTER — Encounter: Payer: Self-pay | Admitting: Family Medicine

## 2021-07-05 IMAGING — MR MR HEAD W/O CM
11 series · 48 of 48 positions shown · non-contrast
Comparison: Prior MRI of the cervical spine from 11/28/2020 as well
as previous head CT from 09/24/2015.

CLINICAL DATA: Initial evaluation for occipital headaches,
dizziness. History of Chiari malformation.

EXAM:
MRI HEAD WITHOUT CONTRAST
TECHNIQUE: Multiplanar, multiecho pulse sequences of the brain and surrounding
structures were obtained without intravenous contrast.

[Series 5: DWI · axial · 3.0mm · 1.36mm/px · z∈[-37,+126]mm · 8 of 112 slices shown (1 of 4)]
[im 1/112]
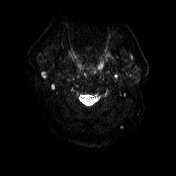
[im 16/112]
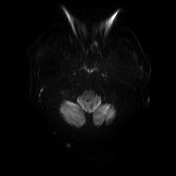
[im 32/112]
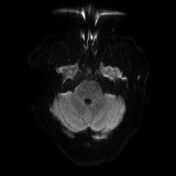
[im 48/112]
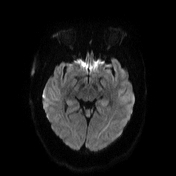
[im 64/112]
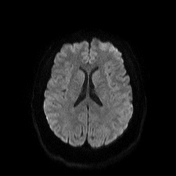
[im 80/112]
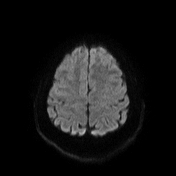
[im 96/112]
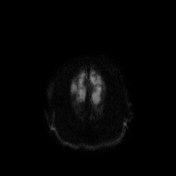
[im 112/112]
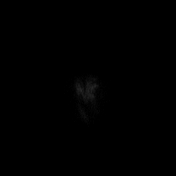

[Series 6: DWI · axial · 3.0mm · 1.36mm/px · z∈[-37,+126]mm · 4 of 56 slices shown (2 of 4)]
[im 1/56]
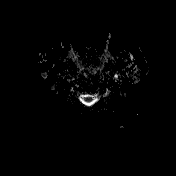
[im 19/56]
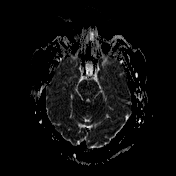
[im 37/56]
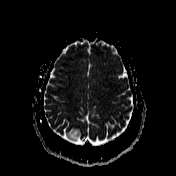
[im 56/56]
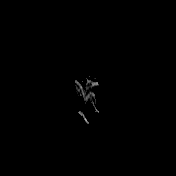

[Series 7: FLAIR · axial · 3.0mm · 0.72mm/px · z∈[-27,+129]mm · 4 of 54 slices shown]
[im 1/54]
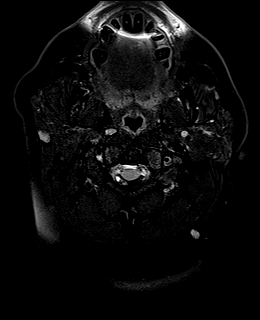
[im 18/54]
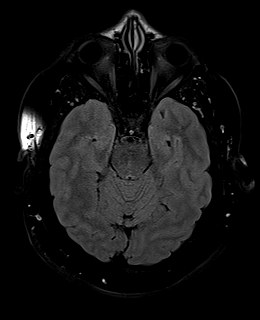
[im 36/54]
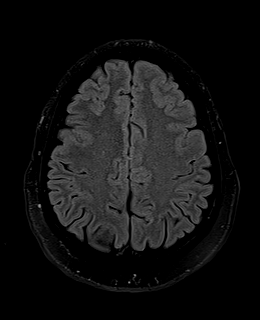
[im 54/54]
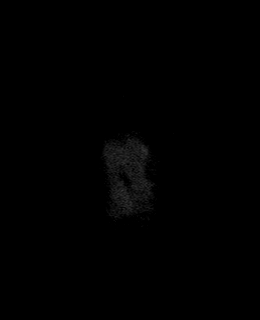

[Series 8: mip_images(sw) · axial · 24.0mm · 0.72mm/px · z∈[-18,+123]mm · 3 of 49 slices shown]
[im 1/49]
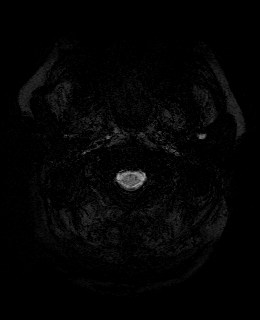
[im 25/49]
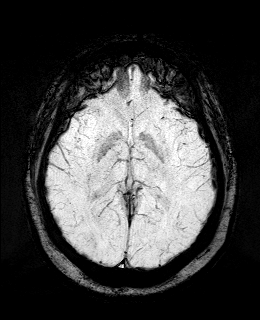
[im 49/49]
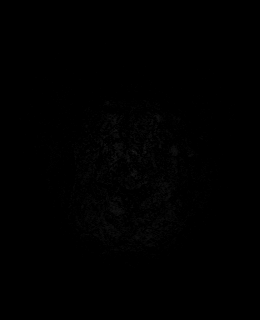

[Series 9: swi_images · axial · 3.0mm · 0.72mm/px · z∈[-28,+133]mm · 4 of 56 slices shown]
[im 1/56]
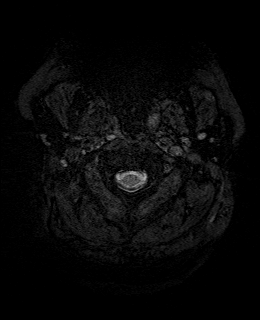
[im 19/56]
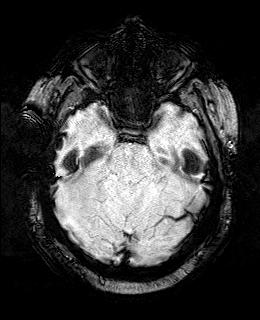
[im 37/56]
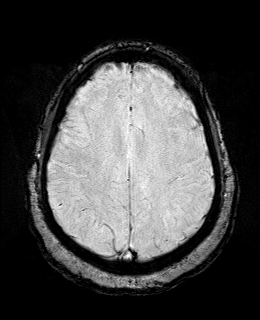
[im 56/56]
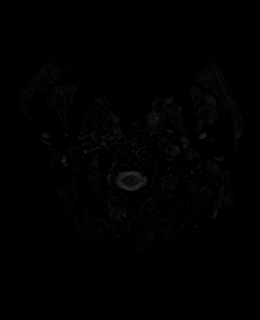

[Series 10: DWI · coronal · 5.0mm · 1.31mm/px · 5 of 72 slices shown (3 of 4)]
[im 1/72]
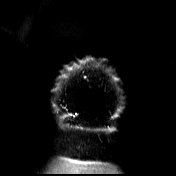
[im 18/72]
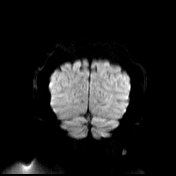
[im 36/72]
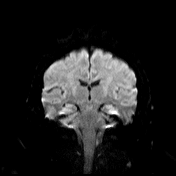
[im 54/72]
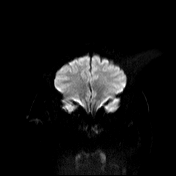
[im 72/72]
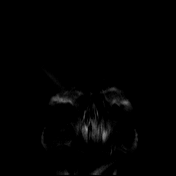

[Series 11: DWI · coronal · 5.0mm · 1.31mm/px · 3 of 36 slices shown (4 of 4)]
[im 1/36]
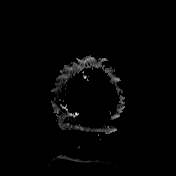
[im 18/36]
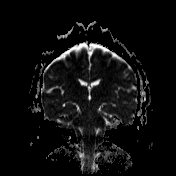
[im 36/36]
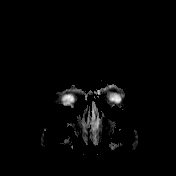

[Series 12: T1 · sagittal · 5.0mm · 0.75mm/px · 2 of 25 slices shown (1 of 2)]
[im 1/25]
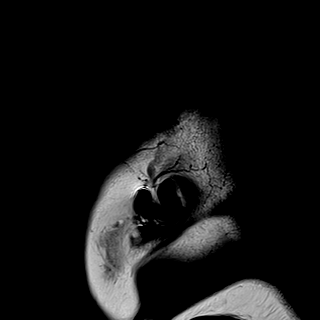
[im 25/25]
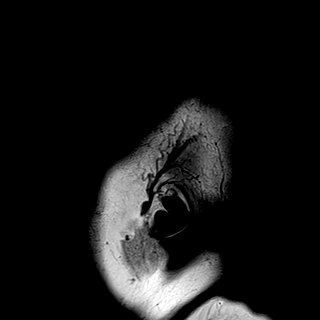

[Series 13: T2 · axial · 5.0mm · 0.60mm/px · z∈[-26,+133]mm · 2 of 26 slices shown (1 of 2)]
[im 1/26]
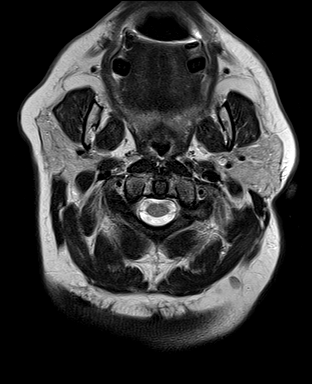
[im 26/26]
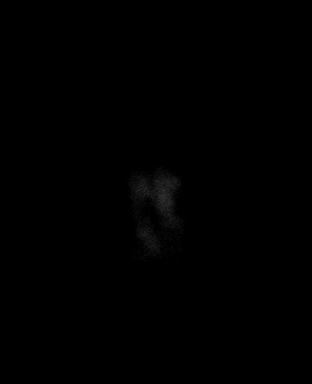

[Series 14: T1 · axial · 1.0mm · 0.90mm/px · z∈[-25,+131]mm · 11 of 160 slices shown (2 of 2)]
[im 1/160]
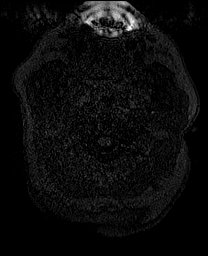
[im 16/160]
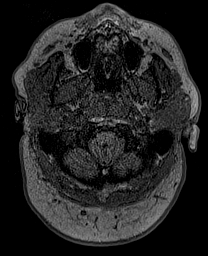
[im 32/160]
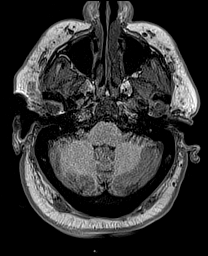
[im 48/160]
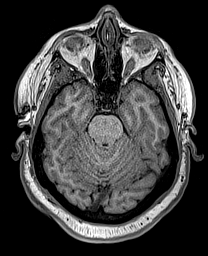
[im 64/160]
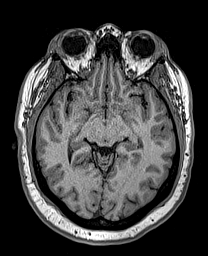
[im 80/160]
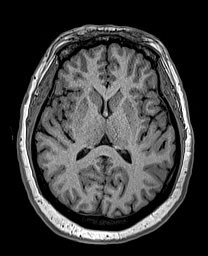
[im 96/160]
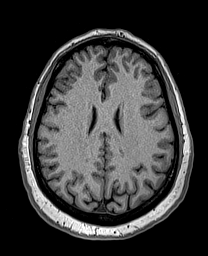
[im 112/160]
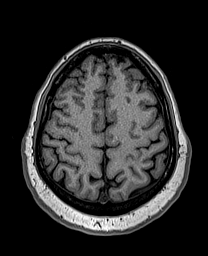
[im 128/160]
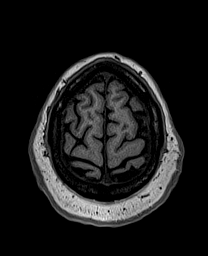
[im 144/160]
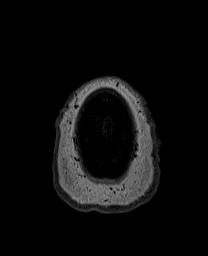
[im 160/160]
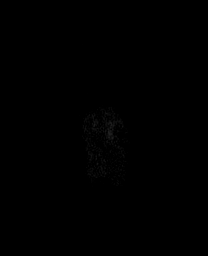

[Series 15: T2 · coronal · 5.0mm · 0.57mm/px · 2 of 29 slices shown (2 of 2)]
[im 1/29]
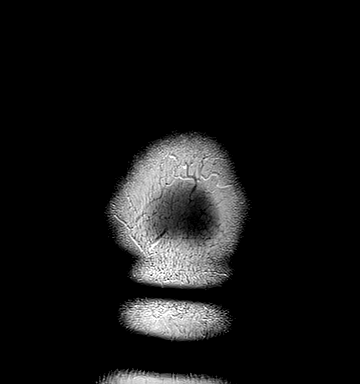
[im 29/29]
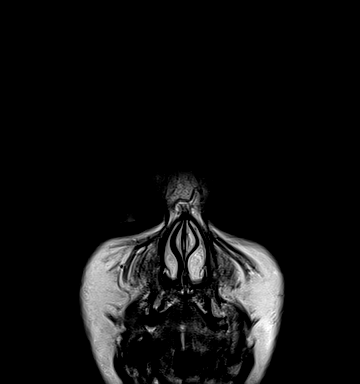

[48 of 48 positions shown; findings below may reference images not displayed]

FINDINGS: Brain: Cerebral volume within normal limits. No focal parenchymal
signal abnormality or significant cerebral white matter disease.

No abnormal foci of restricted diffusion to suggest acute or
subacute ischemia. Gray-white matter differentiation well
maintained. No encephalomalacia to suggest chronic cortical
infarction. No foci of susceptibility artifact to suggest acute or
chronic intracranial hemorrhage.

No mass lesion, midline shift or mass effect. Ventricles normal size
without hydrocephalus. Pituitary gland and suprasellar region within
normal limits. Midline structures intact and normal.

Vascular: Major intracranial vascular flow voids are well maintained
and normal in appearance.

Skull and upper cervical spine: Cerebellar tonsils are low lying
extending up to 13 mm below the foramen magnum with beak morphology,
consistent with a Chiari 1 malformation. No associated syrinx seen
within the upper cervical spinal cord. Secondary crowding at the
foramen magnum. Bone marrow signal intensity within normal limits.
No scalp soft tissue abnormality.

Sinuses/Orbits: Globes and orbital soft tissues within normal
limits. Paranasal sinuses are clear. No significant mastoid
effusion. Inner ear structures grossly normal.

Other: None.
IMPRESSION: 1. Chiari 1 malformation with the cerebellar tonsils extending up to
13 mm below the foramen magnum.
2. Otherwise normal brain MRI.

## 2022-01-23 ENCOUNTER — Encounter: Payer: Self-pay | Admitting: Physician Assistant

## 2022-01-24 ENCOUNTER — Encounter: Payer: Self-pay | Admitting: Physician Assistant

## 2022-05-30 ENCOUNTER — Encounter: Payer: Self-pay | Admitting: Internal Medicine

## 2022-06-15 ENCOUNTER — Emergency Department (HOSPITAL_COMMUNITY)
Admission: EM | Admit: 2022-06-15 | Discharge: 2022-06-15 | Disposition: A | Discharge status: Home / Self Care | Payer: Self-pay | Attending: Emergency Medicine | Admitting: Emergency Medicine

## 2022-06-15 ENCOUNTER — Emergency Department (HOSPITAL_COMMUNITY): Payer: Self-pay

## 2022-06-15 ENCOUNTER — Other Ambulatory Visit: Payer: Self-pay

## 2022-06-15 ENCOUNTER — Encounter (HOSPITAL_COMMUNITY): Payer: Self-pay

## 2022-06-15 DIAGNOSIS — D649 Anemia, unspecified: Secondary | ICD-10-CM | POA: Insufficient documentation

## 2022-06-15 DIAGNOSIS — R079 Chest pain, unspecified: Secondary | ICD-10-CM | POA: Insufficient documentation

## 2022-06-15 DIAGNOSIS — R519 Headache, unspecified: Secondary | ICD-10-CM | POA: Insufficient documentation

## 2022-06-15 LAB — COMPREHENSIVE METABOLIC PANEL
ALT: 18 U/L (ref 0–44)
AST: 26 U/L (ref 15–41)
Albumin: 3.9 g/dL (ref 3.5–5.0)
Alkaline Phosphatase: 55 U/L (ref 38–126)
Anion gap: 7 (ref 5–15)
BUN: 11 mg/dL (ref 6–20)
CO2: 23 mmol/L (ref 22–32)
Calcium: 8.9 mg/dL (ref 8.9–10.3)
Chloride: 106 mmol/L (ref 98–111)
Creatinine, Ser: 0.63 mg/dL (ref 0.44–1.00)
GFR, Estimated: >60 mL/min (ref >60)
Glucose, Bld: 117 mg/dL — ABNORMAL HIGH (ref 70–99)
Potassium: 4 mmol/L (ref 3.5–5.1)
Sodium: 136 mmol/L (ref 135–145)
Total Bilirubin: 0.6 mg/dL (ref 0.3–1.2)
Total Protein: 7.8 g/dL (ref 6.5–8.1)

## 2022-06-15 LAB — CBC
HCT: 28.9 % — ABNORMAL LOW (ref 36.0–46.0)
Hemoglobin: 8.5 g/dL — ABNORMAL LOW (ref 12.0–15.0)
MCH: 21.1 pg — ABNORMAL LOW (ref 26.0–34.0)
MCHC: 29.4 g/dL — ABNORMAL LOW (ref 30.0–36.0)
MCV: 71.7 fL — ABNORMAL LOW (ref 80.0–100.0)
Platelets: 482 10*3/uL — ABNORMAL HIGH (ref 150–400)
RBC: 4.03 MIL/uL (ref 3.87–5.11)
RDW: 19.7 % — ABNORMAL HIGH (ref 11.5–15.5)
WBC: 6.6 10*3/uL (ref 4.0–10.5)
nRBC: 0 % (ref 0.0–0.2)

## 2022-06-15 LAB — HCG, QUANTITATIVE, PREGNANCY: hCG, Beta Chain, Quant, S: <1 m[IU]/mL (ref <5)

## 2022-06-15 LAB — TROPONIN I (HIGH SENSITIVITY): Troponin I (High Sensitivity): 2 ng/L (ref <18)

## 2022-06-15 MED ORDER — KETOROLAC TROMETHAMINE 30 MG/ML IJ SOLN
30.0000 mg | Freq: Once | INTRAMUSCULAR | Status: AC
  Administered 2022-06-15: 30 mg via INTRAVENOUS
  Filled 2022-06-15: qty 1

## 2022-06-15 MED ORDER — METOCLOPRAMIDE HCL 5 MG/ML IJ SOLN
10.0000 mg | Freq: Once | INTRAMUSCULAR | Status: AC
  Administered 2022-06-15: 10 mg via INTRAVENOUS
  Filled 2022-06-15: qty 2

## 2022-06-15 MED ORDER — DIPHENHYDRAMINE HCL 50 MG/ML IJ SOLN
25.0000 mg | Freq: Once | INTRAMUSCULAR | Status: AC
  Administered 2022-06-15: 25 mg via INTRAVENOUS
  Filled 2022-06-15: qty 1

## 2022-06-15 NOTE — ED Notes (Signed)
Pt stated she feels a lot better after medication administration. She was able to ambulate to the restroom w/o difficulty or complaints.

## 2022-06-15 NOTE — ED Triage Notes (Signed)
Pt reports headache and sharp chest pain that began in the middle of the night. Pt denies pain radiating to back and arms. Pt also reports she is currently on the menstrual cycle and is having heavier vaginal bleeding than normal.

## 2022-06-15 NOTE — ED Provider Notes (Signed)
[facility]EMERGENCY DEPT Provider Note   CSN: [id number] Arrival date & time: 06/15/22  4098     History  Chief Complaint  Patient presents with   Headache   Chest Pain    Christina Booth is a 46 y.o. female.  Patient complains of a migraine headache.  Patient reports she also had some chest pain.  Patient states she feels like she is having some anxiety.  Patient denies any weakness patient denies any nausea or vomiting.  Patient reports she has a history of migraines she has had a headache similar to this in the past and came to the emergency department to get a migraine cocktail patient is asking if she can have a migraine cocktail today.  Patient denies any other symptoms she has not had any fever or chills she denies any cough or congestion she denies any upper respiratory infection symptoms  The history is provided by the patient. No language interpreter was used.  Headache Pain location:  Generalized Quality:  Unable to specify Chest Pain Associated symptoms: headache        Home Medications Prior to Admission medications   Medication Sig Start Date End Date Taking? Authorizing Provider  amoxicillin-clavulanate (AUGMENTIN) 875-125 MG tablet Take 1 tablet by mouth 2 (two) times daily. 12/10/20   Henson, Vickie L, NP-C  Ascorbic Acid (VITAMIN C PO) Take 1 tablet by mouth daily.    [provider]  citalopram (CELEXA) 20 MG tablet Take 20 mg by mouth daily. Patient not taking: Reported on 12/12/2020 08/10/19   [provider]  Cyanocobalamin (VITAMIN B 12) 500 MCG TABS Take 500 mcg by mouth daily.    [provider]  diclofenac (VOLTAREN) 75 MG EC tablet Take 1 tablet (75 mg total) by mouth 2 (two) times daily. Patient not taking: No sig reported 01/03/20   Hetty Blend L, NP-C  HYDROcodone-acetaminophen (NORCO/VICODIN) 5-325 MG tablet Take 1 tablet by mouth every 6 (six) hours as needed for moderate pain. Patient not  taking: No sig reported 12/06/20   Valeria Batman, MD  ibuprofen (ADVIL) 800 MG tablet Take 1 tablet (800 mg total) by mouth every 8 (eight) hours as needed. 12/10/20   Henson, Vickie L, NP-C  lisinopril-hydrochlorothiazide (ZESTORETIC) 10-12.5 MG tablet Take 1 tablet by mouth daily. Patient not taking: No sig reported 08/10/19   [provider]      Allergies    Patient has no known allergies.    Review of Systems   Review of Systems  Cardiovascular:  Positive for chest pain.  Neurological:  Positive for headaches.  All other systems reviewed and are negative.   Physical Exam Updated Vital Signs BP (!) 171/98   Pulse 67   Temp 98.1 F (36.7 C) (Oral)   Resp 20   SpO2 100%  Physical Exam Vitals and nursing note reviewed.  Constitutional:      Appearance: She is well-developed.  HENT:     Head: Normocephalic.  Cardiovascular:     Rate and Rhythm: Normal rate and regular rhythm.  Pulmonary:     Effort: Pulmonary effort is normal.  Abdominal:     General: There is no distension.  Musculoskeletal:        General: Normal range of motion.     Cervical back: Normal range of motion.  Skin:    General: Skin is warm.  Neurological:     Mental Status: She is alert and oriented to person, place, and time.  ED Results / Procedures / Treatments   Labs (all labs ordered are listed, but only abnormal results are displayed) Labs Reviewed  CBC - Abnormal; Notable for the following components:      Result Value   Hemoglobin 8.5 (*)    HCT 28.9 (*)    MCV 71.7 (*)    MCH 21.1 (*)    MCHC 29.4 (*)    RDW 19.7 (*)    Platelets 482 (*)    All other components within normal limits  COMPREHENSIVE METABOLIC PANEL - Abnormal; Notable for the following components:   Glucose, Bld 117 (*)    All other components within normal limits  HCG, QUANTITATIVE, PREGNANCY  TROPONIN I (HIGH SENSITIVITY)    EKG EKG Interpretation  Date/Time:  Sunday June 15 2022 08:45:10  EDT Ventricular Rate:  77 PR Interval:  179 QRS Duration: 99 QT Interval:  400 QTC Calculation: 453 R Axis:   0 Text Interpretation: Sinus rhythm No significant change since last tracing Confirmed by Lorre Nick ([id number]) on 06/15/2022 11:58:35 AM  Radiology DG Chest 2 View  Result Date: 06/15/2022 CLINICAL DATA:  46 year old female with chest pain and shortness of breath since last night. EXAM: CHEST - 2 VIEW COMPARISON:  Chest radiographs 01/03/2020 and earlier. FINDINGS: PA and lateral views at 0859 hours. Stable chronically somewhat low lung volumes. Normal cardiac size and mediastinal contours. Visualized tracheal air column is within normal limits. Both lungs appear clear. No pneumothorax or pleural effusion. Stable cholecystectomy clips. Negative visible bowel gas. No acute osseous abnormality identified. IMPRESSION: No acute cardiopulmonary abnormality. Electronically Signed   By: Odessa Fleming M.D.   On: 06/15/2022 09:21    Procedures Procedures    Medications Ordered in ED Medications  ketorolac (TORADOL) 30 MG/ML injection 30 mg (30 mg Intravenous Given 06/15/22 1038)  diphenhydrAMINE (BENADRYL) injection 25 mg (25 mg Intravenous Given 06/15/22 1038)  metoCLOPramide (REGLAN) injection 10 mg (10 mg Intravenous Given 06/15/22 1038)    ED Course/ Medical Decision Making/ A&P                           Medical Decision Making Patient complains of a migraine headache and some discomfort in her chest  Problems Addressed: Acute nonintractable headache, unspecified headache type: acute illness or injury    Details: Patient has a history of migraines she reports this is similar to a headache in the past when she came to the emergency department for treatment  Amount and/or Complexity of Data Reviewed Independent Historian: spouse    Details: Patient is here with a family member who is supportive External Data Reviewed: notes.    Details: Primary care notes are reviewed Labs: ordered.  Decision-making details documented in ED Course.    Details: troponin  is negative patient's hemoglobin is low at 8.5 patient advised to take a multivitamin with iron ECG/medicine tests: ordered and independent interpretation performed. Decision-making details documented in ED Course.    Details: EKG is obtained reviewed and interpreted as no acute changes Discussion of management or test interpretation with external provider(s): Patient is given Reglan Toradol and Benadryl IV she reports her headache is much improved and wants to go home.  Patient states she is not currently having any chest pain she feels like her chest pain was related to anxiety because of her headache.  Patient is advised to follow-up with her primary care physician for evaluation of anemia  Risk Prescription drug management.  Final Clinical Impression(s) / ED Diagnoses Final diagnoses:  Acute nonintractable headache, unspecified headache type  Anemia, unspecified type    Rx / DC Orders ED Discharge Orders     None      An After Visit Summary was printed and given to the patient.    Osie Cheeks 06/15/22 1338    Lorre Nick, MD 06/16/22 1440

## 2022-06-15 NOTE — ED Provider Triage Note (Signed)
Emergency Medicine Provider Triage Evaluation Note  LYNIAH FUJITA , a 46 y.o. female  was evaluated in triage.  Pt complains of a migraine headache. Pt also reports chest pain.  Pt thinks she is having a panic attack  Review of Systems  Positive: headache Negative: Fever no cough  Physical Exam  BP (!) 194/92 (BP Location: Left Arm)   Pulse 73   Temp 98.1 F (36.7 C) (Oral)   Resp 18   SpO2 100%  Gen:   Awake, no distress   Resp:  Normal effort  MSK:   Moves extremities without difficulty  Other:    Medical Decision Making  Medically screening exam initiated at 9:12 AM.  Appropriate orders placed.  Aline Brochure was informed that the remainder of the evaluation will be completed by another provider, this initial triage assessment does not replace that evaluation, and the importance of remaining in the ED until their evaluation is complete.     Elson Areas, [place] 06/15/22 [phone]

## 2022-06-15 NOTE — Discharge Instructions (Signed)
Make sure you are taking a multi vitamin with iron.  See your Physician for recheck.

## 2023-03-17 ENCOUNTER — Emergency Department (HOSPITAL_COMMUNITY): Payer: BC Managed Care – PPO

## 2023-03-17 ENCOUNTER — Emergency Department (HOSPITAL_COMMUNITY)
Admission: EM | Admit: 2023-03-17 | Discharge: 2023-03-18 | Disposition: A | Discharge status: Home / Self Care | Payer: BC Managed Care – PPO | Attending: Emergency Medicine | Admitting: Emergency Medicine

## 2023-03-17 ENCOUNTER — Encounter (HOSPITAL_COMMUNITY): Payer: Self-pay

## 2023-03-17 ENCOUNTER — Other Ambulatory Visit: Payer: Self-pay

## 2023-03-17 DIAGNOSIS — Z79899 Other long term (current) drug therapy: Secondary | ICD-10-CM | POA: Diagnosis not present

## 2023-03-17 DIAGNOSIS — R519 Headache, unspecified: Secondary | ICD-10-CM

## 2023-03-17 DIAGNOSIS — Z8616 Personal history of COVID-19: Secondary | ICD-10-CM | POA: Diagnosis not present

## 2023-03-17 DIAGNOSIS — I10 Essential (primary) hypertension: Secondary | ICD-10-CM | POA: Insufficient documentation

## 2023-03-17 DIAGNOSIS — D649 Anemia, unspecified: Secondary | ICD-10-CM | POA: Diagnosis not present

## 2023-03-17 DIAGNOSIS — R0789 Other chest pain: Secondary | ICD-10-CM | POA: Diagnosis not present

## 2023-03-17 DIAGNOSIS — R079 Chest pain, unspecified: Secondary | ICD-10-CM

## 2023-03-17 LAB — CBC WITH DIFFERENTIAL/PLATELET
Abs Immature Granulocytes: 0.03 10*3/uL (ref 0.00–0.07)
Basophils Absolute: 0 10*3/uL (ref 0.0–0.1)
Basophils Relative: 0 %
Eosinophils Absolute: 0.2 10*3/uL (ref 0.0–0.5)
Eosinophils Relative: 2 %
HCT: 31.8 % — ABNORMAL LOW (ref 36.0–46.0)
Hemoglobin: 9.4 g/dL — ABNORMAL LOW (ref 12.0–15.0)
Immature Granulocytes: 0 %
Lymphocytes Relative: 34 %
Lymphs Abs: 3.3 10*3/uL (ref 0.7–4.0)
MCH: 22.8 pg — ABNORMAL LOW (ref 26.0–34.0)
MCHC: 29.6 g/dL — ABNORMAL LOW (ref 30.0–36.0)
MCV: 77.2 fL — ABNORMAL LOW (ref 80.0–100.0)
Monocytes Absolute: 0.7 10*3/uL (ref 0.1–1.0)
Monocytes Relative: 7 %
Neutro Abs: 5.4 10*3/uL (ref 1.7–7.7)
Neutrophils Relative %: 57 %
Platelets: 445 10*3/uL — ABNORMAL HIGH (ref 150–400)
RBC: 4.12 MIL/uL (ref 3.87–5.11)
RDW: 22.8 % — ABNORMAL HIGH (ref 11.5–15.5)
WBC: 9.7 10*3/uL (ref 4.0–10.5)
nRBC: 0 % (ref 0.0–0.2)

## 2023-03-17 LAB — BASIC METABOLIC PANEL
Anion gap: 7 (ref 5–15)
BUN: 15 mg/dL (ref 6–20)
CO2: 24 mmol/L (ref 22–32)
Calcium: 9.1 mg/dL (ref 8.9–10.3)
Chloride: 105 mmol/L (ref 98–111)
Creatinine, Ser: 0.8 mg/dL (ref 0.44–1.00)
GFR, Estimated: >60 mL/min (ref >60)
Glucose, Bld: 91 mg/dL (ref 70–99)
Potassium: 3.7 mmol/L (ref 3.5–5.1)
Sodium: 136 mmol/L (ref 135–145)

## 2023-03-17 LAB — I-STAT BETA HCG BLOOD, ED (MC, WL, AP ONLY): I-stat hCG, quantitative: <5 m[IU]/mL (ref <5)

## 2023-03-17 LAB — TROPONIN I (HIGH SENSITIVITY): Troponin I (High Sensitivity): 2 ng/L (ref <18)

## 2023-03-17 MED ORDER — ACETAMINOPHEN 500 MG PO TABS
1000.0000 mg | ORAL_TABLET | Freq: Once | ORAL | Status: AC
  Administered 2023-03-17: 1000 mg via ORAL
  Filled 2023-03-17: qty 2

## 2023-03-17 MED ORDER — KETOROLAC TROMETHAMINE 60 MG/2ML IM SOLN
30.0000 mg | Freq: Once | INTRAMUSCULAR | Status: AC
  Administered 2023-03-17: 30 mg via INTRAMUSCULAR
  Filled 2023-03-17: qty 2

## 2023-03-17 NOTE — ED Provider Notes (Incomplete)
[place] EMERGENCY DEPARTMENT AT [facility] Provider Note  CSN: [id number] Arrival date & time: 03/17/23 2019  Chief Complaint(s) Chest Pain and Headache  HPI Christina Booth is a 47 y.o. female {Add pertinent medical, surgical, social history, OB history to HPI:1}    Chest Pain Associated symptoms: headache   Headache   Past Medical History Past Medical History:  Diagnosis Date   Back pain    Morbid obesity 12/29/2018   Osteoarthritis 07/09/2018   Prediabetes 12/30/2018   Vitamin D deficiency 12/30/2018   Patient Active Problem List   Diagnosis Date Noted   Radiculopathy, cervical region 11/27/2020   Short of breath on exertion 01/03/2020   Intermittent left-sided chest pain 01/03/2020   Chronic left shoulder pain 01/03/2020   History of COVID-19 01/03/2020   Orthopnea 01/03/2020   Prediabetes 12/30/2018   Vitamin D deficiency 12/30/2018   Morbid obesity 12/29/2018   Anemia 12/29/2018   Excessive daytime sleepiness 12/29/2018   Osteoarthritis 07/09/2018   Home Medication(s) Prior to Admission medications   Medication Sig Start Date End Date Taking? Authorizing Provider  amoxicillin-clavulanate (AUGMENTIN) 875-125 MG tablet Take 1 tablet by mouth 2 (two) times daily. 12/10/20   Henson, Vickie L, NP-C  Ascorbic Acid (VITAMIN C PO) Take 1 tablet by mouth daily.    [provider]  citalopram (CELEXA) 20 MG tablet Take 20 mg by mouth daily. Patient not taking: Reported on 12/12/2020 08/10/19   [provider]  Cyanocobalamin (VITAMIN B 12) 500 MCG TABS Take 500 mcg by mouth daily.    [provider]  diclofenac (VOLTAREN) 75 MG EC tablet Take 1 tablet (75 mg total) by mouth 2 (two) times daily. Patient not taking: No sig reported 01/03/20   Hetty Blend L, NP-C  HYDROcodone-acetaminophen (NORCO/VICODIN) 5-325 MG tablet Take 1 tablet by mouth every 6 (six) hours as needed for moderate pain. Patient not taking: No sig reported 12/06/20    Valeria Batman, MD  ibuprofen (ADVIL) 800 MG tablet Take 1 tablet (800 mg total) by mouth every 8 (eight) hours as needed. 12/10/20   Henson, Vickie L, NP-C  lisinopril-hydrochlorothiazide (ZESTORETIC) 10-12.5 MG tablet Take 1 tablet by mouth daily. Patient not taking: No sig reported 08/10/19   [provider]                                                                                                                                    Allergies Patient has no known allergies.  Review of Systems Review of Systems  Cardiovascular:  Positive for chest pain.  Neurological:  Positive for headaches.   As noted in HPI  Physical Exam Vital Signs  I have reviewed the triage vital signs BP (!) 155/88   Pulse 84   Temp 98.5 F (36.9 C)   Resp 18   Ht  (1.651 m)   Wt 131.5 kg   LMP 03/17/2023  SpO2 99%   BMI 48.26 kg/m  *** Physical Exam  ED Results and Treatments Labs (all labs ordered are listed, but only abnormal results are displayed) Labs Reviewed  CBC WITH DIFFERENTIAL/PLATELET - Abnormal; Notable for the following components:      Result Value   Hemoglobin 9.4 (*)    HCT 31.8 (*)    MCV 77.2 (*)    MCH 22.8 (*)    MCHC 29.6 (*)    RDW 22.8 (*)    Platelets 445 (*)    All other components within normal limits  BASIC METABOLIC PANEL  I-STAT BETA HCG BLOOD, ED ([facility], [facility], [facility] ONLY)  TROPONIN I (HIGH SENSITIVITY)  TROPONIN I (HIGH SENSITIVITY)                                                                                                                         EKG  EKG Interpretation  Date/Time:    Ventricular Rate:    PR Interval:    QRS Duration:   QT Interval:    QTC Calculation:   R Axis:     Text Interpretation:         Radiology DG Chest 2 View  Result Date: 03/17/2023 CLINICAL DATA:  Chest pain EXAM: CHEST - 2 VIEW COMPARISON:  06/15/2022 FINDINGS: The heart size and mediastinal contours are within normal limits. Both lungs are  clear. The visualized skeletal structures are unremarkable. IMPRESSION: No active cardiopulmonary disease. Electronically Signed   By: Helyn Numbers M.D.   On: 03/17/2023 20:51    Medications Ordered in ED Medications - No data to display                                                                                                                                   Procedures Procedures  (including critical care time)  Medical Decision Making / ED Course  Click here for ABCD2, HEART and other calculators  Medical Decision Making Amount and/or Complexity of Data Reviewed Labs: ordered. Radiology: ordered.          Final Clinical Impression(s) / ED Diagnoses Final diagnoses:  None    {Document critical care time when appropriate:1}  {Document review of labs and clinical decision tools ie heart score, Chads2Vasc2 etc:1}  {Document your independent review of radiology images, and any outside records:1} {Document your discussion with family members, caretakers, and with consultants:1} {Document social determinants  of health affecting pt's care:1} {Document your decision making why or why not admission, treatments were needed:1} This chart was dictated using voice recognition software.  Despite best efforts to proofread,  errors can occur which can change the documentation meaning.

## 2023-03-17 NOTE — ED Provider Triage Note (Signed)
Emergency Medicine Provider Triage Evaluation Note  Christina Booth , a 47 y.o. female  was evaluated in triage.  Pt complains of headache, chest pain.  Patient reports headache is been ongoing for the last 30 days.  Patient reports chest pain is ongoing for the last 2 to 3 days.  Patient states chest pain radiates to bilateral upper extremities, she believes that it is secondary to exhaustion.  Patient denies shortness of breath, nausea, vomiting, fevers.  Patient denies history of blood clots.  Patient also reports headache ongoing for the last 30 days, reports history of migraines.  Patient has been seen by neurology in the past, had MRI which showed "tumors" but patient reports never followed up.  Patient denies one-sided weakness or numbness, medications prior to arrival.  Review of Systems  Positive:  Negative:   Physical Exam  BP (!) 155/88   Pulse 84   Temp 98.5 F (36.9 C)   Resp 18   Ht  (1.651 m)   Wt 131.5 kg   LMP 03/17/2023   SpO2 99%   BMI 48.26 kg/m  Gen:   Awake, no distress   Resp:  Normal effort  MSK:   Moves extremities without difficulty  Other:  Clear lung sounds.  Normal neurological examination.  Medical Decision Making  Medically screening exam initiated at 9:00 PM.  Appropriate orders placed.  Christina Booth was informed that the remainder of the evaluation will be completed by another provider, this initial triage assessment does not replace that evaluation, and the importance of remaining in the ED until their evaluation is complete.     Al Decant, PA-C 03/17/23 2101

## 2023-03-17 NOTE — ED Triage Notes (Signed)
Mid chest discomfort and tightness x 3 days. Says it began while at work and suspected indigestion.   Pain worse when laying flat.   Also says she has been having recurrent headaches that she suspects is related to abnormal MRI. She declined surgery for it but says she may need to see neurology again.

## 2023-03-18 ENCOUNTER — Telehealth: Payer: Self-pay

## 2023-03-18 NOTE — Discharge Instructions (Addendum)
You may use over-the-counter Motrin (Ibuprofen), Acetaminophen (Tylenol), topical muscle creams such as SalonPas, Icy Hot, Bengay, etc. Please stretch, apply ice or heat (whichever helps), and have massage therapy for additional assistance.  

## 2023-03-18 NOTE — Telephone Encounter (Signed)
Pt called asking to re-establish care under [facility]. I was able to inform pt I will inform Vickie of this request and if she approves the office will reach out to get her scheduled.

## 2023-12-03 ENCOUNTER — Encounter (HOSPITAL_BASED_OUTPATIENT_CLINIC_OR_DEPARTMENT_OTHER): Payer: Self-pay | Admitting: Emergency Medicine

## 2023-12-03 ENCOUNTER — Emergency Department (HOSPITAL_BASED_OUTPATIENT_CLINIC_OR_DEPARTMENT_OTHER): Payer: Self-pay | Admitting: Radiology

## 2023-12-03 ENCOUNTER — Emergency Department (HOSPITAL_BASED_OUTPATIENT_CLINIC_OR_DEPARTMENT_OTHER)
Admission: EM | Admit: 2023-12-03 | Discharge: 2023-12-03 | Disposition: A | Discharge status: Home / Self Care | Payer: Self-pay | Attending: Emergency Medicine | Admitting: Emergency Medicine

## 2023-12-03 ENCOUNTER — Other Ambulatory Visit: Payer: Self-pay

## 2023-12-03 DIAGNOSIS — Z20822 Contact with and (suspected) exposure to covid-19: Secondary | ICD-10-CM | POA: Insufficient documentation

## 2023-12-03 DIAGNOSIS — Z8616 Personal history of COVID-19: Secondary | ICD-10-CM | POA: Insufficient documentation

## 2023-12-03 DIAGNOSIS — B349 Viral infection, unspecified: Secondary | ICD-10-CM | POA: Insufficient documentation

## 2023-12-03 LAB — URINALYSIS, W/ REFLEX TO CULTURE (INFECTION SUSPECTED)
Bacteria, UA: NONE SEEN
Bilirubin Urine: NEGATIVE
Glucose, UA: NEGATIVE mg/dL
Ketones, ur: NEGATIVE mg/dL
Leukocytes,Ua: NEGATIVE
Nitrite: NEGATIVE
Specific Gravity, Urine: 1.022 (ref 1.005–1.030)
pH: 6.5 (ref 5.0–8.0)

## 2023-12-03 LAB — CBC
HCT: 28.8 % — ABNORMAL LOW (ref 36.0–46.0)
Hemoglobin: 8.8 g/dL — ABNORMAL LOW (ref 12.0–15.0)
MCH: 22.9 pg — ABNORMAL LOW (ref 26.0–34.0)
MCHC: 30.6 g/dL (ref 30.0–36.0)
MCV: 75 fL — ABNORMAL LOW (ref 80.0–100.0)
Platelets: 443 10*3/uL — ABNORMAL HIGH (ref 150–400)
RBC: 3.84 MIL/uL — ABNORMAL LOW (ref 3.87–5.11)
RDW: 16.8 % — ABNORMAL HIGH (ref 11.5–15.5)
WBC: 9.3 10*3/uL (ref 4.0–10.5)
nRBC: 0 % (ref 0.0–0.2)

## 2023-12-03 LAB — BASIC METABOLIC PANEL
Anion gap: 9 (ref 5–15)
BUN: 12 mg/dL (ref 6–20)
CO2: 24 mmol/L (ref 22–32)
Calcium: 9 mg/dL (ref 8.9–10.3)
Chloride: 105 mmol/L (ref 98–111)
Creatinine, Ser: 0.76 mg/dL (ref 0.44–1.00)
GFR, Estimated: >60 mL/min (ref >60)
Glucose, Bld: 122 mg/dL — ABNORMAL HIGH (ref 70–99)
Potassium: 3.6 mmol/L (ref 3.5–5.1)
Sodium: 138 mmol/L (ref 135–145)

## 2023-12-03 LAB — RESP PANEL BY RT-PCR (RSV, FLU A&B, COVID)  RVPGX2
Influenza A by PCR: NEGATIVE
Influenza B by PCR: NEGATIVE
Resp Syncytial Virus by PCR: NEGATIVE
SARS Coronavirus 2 by RT PCR: NEGATIVE

## 2023-12-03 LAB — TROPONIN I (HIGH SENSITIVITY): Troponin I (High Sensitivity): 5 ng/L (ref <18)

## 2023-12-03 LAB — PREGNANCY, URINE: Preg Test, Ur: NEGATIVE

## 2023-12-03 MED ORDER — METOCLOPRAMIDE HCL 5 MG/ML IJ SOLN
10.0000 mg | Freq: Once | INTRAMUSCULAR | Status: AC
  Administered 2023-12-03: 10 mg via INTRAVENOUS
  Filled 2023-12-03: qty 2

## 2023-12-03 MED ORDER — KETOROLAC TROMETHAMINE 15 MG/ML IJ SOLN
15.0000 mg | Freq: Once | INTRAMUSCULAR | Status: AC
  Administered 2023-12-03: 15 mg via INTRAVENOUS
  Filled 2023-12-03: qty 1

## 2023-12-03 MED ORDER — SODIUM CHLORIDE 0.9 % IV BOLUS
1000.0000 mL | Freq: Once | INTRAVENOUS | Status: AC
  Administered 2023-12-03: 1000 mL via INTRAVENOUS

## 2023-12-03 MED ORDER — DROPERIDOL 2.5 MG/ML IJ SOLN
1.2500 mg | Freq: Once | INTRAMUSCULAR | Status: AC
  Administered 2023-12-03: 1.25 mg via INTRAVENOUS
  Filled 2023-12-03: qty 2

## 2023-12-03 NOTE — ED Notes (Signed)
Reviewed discharge instructions and home care with pt. Pt verbalized understanding and had no further questions. Pt exited ED without complications.

## 2023-12-03 NOTE — Discharge Instructions (Addendum)
 While you were in the emergency room, you had labs done that overall were normal.  Your chest x-ray did not show any pneumonia.  Urine does not have any infection.  You received medications here to help out with headache.  At home, I recommend that you take 400 mg of ibuprofen  every 6 hours, 1000 mg of Tylenol  every 8 hours.  Make sure that you are getting plenty of water to drink.  I would recommend staying home from work the next 2 days.  Please follow-up with your primary care doctor within 1 week.  If you do not have a primary care doctor, there is information in your discharge paperwork to establish care with 1.

## 2023-12-03 NOTE — ED Triage Notes (Signed)
 Pt c/o mid sternal CP x 2 days. Hoarseness noted. Pt also reports cough, fever, HA and body aches. Endorses diarrhea x 3 days

## 2023-12-03 NOTE — ED Provider Notes (Signed)
 [place] EMERGENCY DEPARTMENT AT [facility] Provider Note  CSN: [id number] Arrival date & time: 12/03/23 9157  Chief Complaint(s) Chest Pain  HPI Christina Booth is a 48 y.o. female here today for cough, myalgias, sore throat, hoarseness, diarrhea, chest pain which is been ongoing for the last 3 days.   Past Medical History Past Medical History:  Diagnosis Date   Back pain    Morbid obesity (HCC) 12/29/2018   Osteoarthritis 07/09/2018   Prediabetes 12/30/2018   Vitamin D  deficiency 12/30/2018   Patient Active Problem List   Diagnosis Date Noted   Radiculopathy, cervical region 11/27/2020   Short of breath on exertion 01/03/2020   Intermittent left-sided chest pain 01/03/2020   Chronic left shoulder pain 01/03/2020   History of COVID-19 01/03/2020   Orthopnea 01/03/2020   Prediabetes 12/30/2018   Vitamin D  deficiency 12/30/2018   Morbid obesity (HCC) 12/29/2018   Anemia 12/29/2018   Excessive daytime sleepiness 12/29/2018   Osteoarthritis 07/09/2018   Home Medication(s) Prior to Admission medications   Medication Sig Start Date End Date Taking? Authorizing Provider  busPIRone (BUSPAR) 10 MG tablet Take 10 mg by mouth 3 (three) times daily as needed. 07/10/23  Yes [provider]  traZODone (DESYREL) 50 MG tablet Take 50 mg by mouth daily as needed. 07/10/23  Yes [provider]  amoxicillin -clavulanate (AUGMENTIN ) 875-125 MG tablet Take 1 tablet by mouth 2 (two) times daily. 12/10/20   Henson, Vickie L, NP-C  Ascorbic Acid (VITAMIN C PO) Take 1 tablet by mouth daily.    [provider]  Cyanocobalamin  (VITAMIN B 12) 500 MCG TABS Take 500 mcg by mouth daily.    [provider]  diclofenac  (VOLTAREN ) 75 MG EC tablet Take 1 tablet (75 mg total) by mouth 2 (two) times daily. Patient not taking: No sig reported 01/03/20   Lendia Nordmann L, NP-C  HYDROcodone -acetaminophen  (NORCO/VICODIN) 5-325 MG tablet Take 1 tablet by mouth every 6 (six)  hours as needed for moderate pain. Patient not taking: No sig reported 12/06/20   Anderson Maude ORN, MD  ibuprofen  (ADVIL ) 800 MG tablet Take 1 tablet (800 mg total) by mouth every 8 (eight) hours as needed. 12/10/20   Lendia Nordmann LITTIE, NP-C                                                                                                                                    Past Surgical History Past Surgical History:  Procedure Laterality Date   btl     CHOLECYSTECTOMY     UPPER GI ENDOSCOPY  04/14/2018   normal   Family History History reviewed. No pertinent family history.  Social History Social History   Tobacco Use   Smoking status: Never   Smokeless tobacco: Never  Substance Use Topics   Alcohol use: Yes    Comment: occ   Drug use: No   Allergies Penicillins  Review of Systems  Review of Systems  Physical Exam Vital Signs  I have reviewed the triage vital signs BP 136/73   Pulse 92   Temp 98.3 F (36.8 C) (Oral)   Resp 18   Wt 108.9 kg   LMP 11/12/2023   SpO2 96%   BMI 39.94 kg/m   Physical Exam Constitutional:      Appearance: She is not toxic-appearing.  Cardiovascular:     Rate and Rhythm: Normal rate.     Heart sounds: Normal heart sounds.  Pulmonary:     Effort: Pulmonary effort is normal. No tachypnea.     Breath sounds: No decreased breath sounds.  Musculoskeletal:        General: Normal range of motion.  Neurological:     General: No focal deficit present.     Mental Status: She is alert.     Comments: Normal gait     ED Results and Treatments Labs (all labs ordered are listed, but only abnormal results are displayed) Labs Reviewed  BASIC METABOLIC PANEL - Abnormal; Notable for the following components:      Result Value   Glucose, Bld 122 (*)    All other components within normal limits  CBC - Abnormal; Notable for the following components:   RBC 3.84 (*)    Hemoglobin 8.8 (*)    HCT 28.8 (*)    MCV 75.0 (*)    MCH 22.9 (*)    RDW  16.8 (*)    Platelets 443 (*)    All other components within normal limits  URINALYSIS, W/ REFLEX TO CULTURE (INFECTION SUSPECTED) - Abnormal; Notable for the following components:   Hgb urine dipstick LARGE (*)    Protein, ur TRACE (*)    All other components within normal limits  RESP PANEL BY RT-PCR (RSV, FLU A&B, COVID)  RVPGX2  PREGNANCY, URINE  TROPONIN I (HIGH SENSITIVITY)                                                                                                                          Radiology DG Chest 2 View Result Date: 12/03/2023 CLINICAL DATA:  Chest pain.  Cough and fever.  Headache. EXAM: CHEST - 2 VIEW COMPARISON:  Two-view chest x-ray 8/scratched at two-view chest x-ray 03/17/2023 FINDINGS: The heart size is normal. Lung volumes are low. No edema or effusion is present. No focal airspace disease is present. IMPRESSION: 1. Low lung volumes. 2. No acute cardiopulmonary disease. Electronically Signed   By: Lonni Necessary M.D.   On: 12/03/2023 10:46    Pertinent labs & imaging results that were available during my care of the patient were reviewed by me and considered in my medical decision making (see MDM for details).  Medications Ordered in ED Medications  ketorolac  (TORADOL ) 15 MG/ML injection 15 mg (15 mg Intravenous Given 12/03/23 1018)  metoCLOPramide  (REGLAN ) injection 10 mg (10 mg Intravenous Given 12/03/23 1018)  sodium chloride  0.9 % bolus 1,000 mL (0 mLs Intravenous  Stopped 12/03/23 1130)  droperidol  (INAPSINE ) 2.5 MG/ML injection 1.25 mg (1.25 mg Intravenous Given 12/03/23 1354)                                                                                                                                     Procedures Procedures  (including critical care time)  Medical Decision Making / ED Course   This patient presents to the ED for concern of hoarseness, cough, fever, headache, body aches, this involves an extensive number of treatment options, and  is a complaint that carries with it a high risk of complications and morbidity.  The differential diagnosis includes viral syndrome.  MDM: Patient here with viral symptoms.  She has had a cough, fever, headache and bodyaches.  She describes feeling as though everything hurts, including taking a shower.  She has no obvious rash, does have some herpangina on her tongue.  Her vital signs are reassuring, her exam is reassuring.  Did obtain blood work on the patient which did not show any leukocytosis, renal function normal.  Did symptomatically treat the patient's headache which did have some improvement.  Patient without signs of meningitis.  Will discharge patient have her follow-up with her PCP.   Additional history obtained: -Additional history obtained from  -External records from outside source obtained and reviewed including: Chart review including previous notes, labs, imaging, consultation notes   Lab Tests: -I ordered, reviewed, and interpreted labs.   The pertinent results include:   Labs Reviewed  BASIC METABOLIC PANEL - Abnormal; Notable for the following components:      Result Value   Glucose, Bld 122 (*)    All other components within normal limits  CBC - Abnormal; Notable for the following components:   RBC 3.84 (*)    Hemoglobin 8.8 (*)    HCT 28.8 (*)    MCV 75.0 (*)    MCH 22.9 (*)    RDW 16.8 (*)    Platelets 443 (*)    All other components within normal limits  URINALYSIS, W/ REFLEX TO CULTURE (INFECTION SUSPECTED) - Abnormal; Notable for the following components:   Hgb urine dipstick LARGE (*)    Protein, ur TRACE (*)    All other components within normal limits  RESP PANEL BY RT-PCR (RSV, FLU A&B, COVID)  RVPGX2  PREGNANCY, URINE  TROPONIN I (HIGH SENSITIVITY)      EKG sinus rhythm, no ST segment depressions or elevations, no acute ischemia.  EKG Interpretation Date/Time:    Ventricular Rate:    PR Interval:    QRS Duration:    QT Interval:    QTC  Calculation:   R Axis:      Text Interpretation:           Imaging Studies ordered: I ordered imaging studies including chest x-ray I independently visualized and interpreted imaging. I agree with the radiologist interpretation   Medicines ordered and  prescription drug management: Meds ordered this encounter  Medications   ketorolac  (TORADOL ) 15 MG/ML injection 15 mg   metoCLOPramide  (REGLAN ) injection 10 mg   sodium chloride  0.9 % bolus 1,000 mL   droperidol  (INAPSINE ) 2.5 MG/ML injection 1.25 mg    -I have reviewed the patients home medicines and have made adjustments as needed   Cardiac Monitoring: The patient was maintained on a cardiac monitor.  I personally viewed and interpreted the cardiac monitored which showed an underlying rhythm of: Normal sinus rhythm  Social Determinants of Health:  Factors impacting patients care include:    Reevaluation: After the interventions noted above, I reevaluated the patient and found that they have : improved Co morbidities that complicate the patient evaluation  Past Medical History:  Diagnosis Date   Back pain    Morbid obesity (HCC) 12/29/2018   Osteoarthritis 07/09/2018   Prediabetes 12/30/2018   Vitamin D  deficiency 12/30/2018      Dispostion: Appropriate for outpatient management.     Final Clinical Impression(s) / ED Diagnoses Final diagnoses:  Viral syndrome     @PCDICTATION @    Mannie Pac T, DO 12/03/23 1450

## 2023-12-03 NOTE — ED Notes (Signed)
 Patient is walking to the restroom with assistance , and obtain urine sample.

## 2023-12-27 ENCOUNTER — Emergency Department (HOSPITAL_COMMUNITY): Payer: Self-pay

## 2023-12-27 ENCOUNTER — Emergency Department (HOSPITAL_COMMUNITY)
Admission: EM | Admit: 2023-12-27 | Discharge: 2023-12-27 | Disposition: A | Discharge status: Home / Self Care | Payer: Self-pay | Attending: Emergency Medicine | Admitting: Emergency Medicine

## 2023-12-27 ENCOUNTER — Other Ambulatory Visit: Payer: Self-pay

## 2023-12-27 DIAGNOSIS — J101 Influenza due to other identified influenza virus with other respiratory manifestations: Secondary | ICD-10-CM | POA: Insufficient documentation

## 2023-12-27 DIAGNOSIS — R42 Dizziness and giddiness: Secondary | ICD-10-CM | POA: Insufficient documentation

## 2023-12-27 DIAGNOSIS — Z20822 Contact with and (suspected) exposure to covid-19: Secondary | ICD-10-CM | POA: Insufficient documentation

## 2023-12-27 DIAGNOSIS — H9203 Otalgia, bilateral: Secondary | ICD-10-CM | POA: Insufficient documentation

## 2023-12-27 LAB — RESP PANEL BY RT-PCR (RSV, FLU A&B, COVID)  RVPGX2
Influenza A by PCR: POSITIVE — AB
Influenza B by PCR: NEGATIVE
Resp Syncytial Virus by PCR: NEGATIVE
SARS Coronavirus 2 by RT PCR: NEGATIVE

## 2023-12-27 MED ORDER — FLUTICASONE PROPIONATE 50 MCG/ACT NA SUSP: 2.0000 | Freq: Every day | NASAL | 1 refills | Status: AC

## 2023-12-27 MED ORDER — MECLIZINE HCL 25 MG PO TABS: 25.0000 mg | ORAL_TABLET | Freq: Three times a day (TID) | ORAL | 0 refills | Status: DC | PRN

## 2023-12-27 MED ORDER — MECLIZINE HCL 25 MG PO TABS
25.0000 mg | ORAL_TABLET | Freq: Once | ORAL | Status: AC
  Administered 2023-12-27: 25 mg via ORAL
  Filled 2023-12-27: qty 1

## 2023-12-27 MED ORDER — ONDANSETRON HCL 4 MG PO TABS: 4.0000 mg | ORAL_TABLET | Freq: Three times a day (TID) | ORAL | 0 refills | Status: DC | PRN

## 2023-12-27 NOTE — ED Triage Notes (Signed)
Patient to ED by POV with c/o flu like symptoms. She reports cough, sob, fever and ear pian x3 weeks. She has taken OTC meds with no relief. Was seen 3 weeks ago and was told she had a virus but was not given meds.

## 2023-12-27 NOTE — Discharge Instructions (Addendum)
Today you are seen for influenza A.  Please pick up your medications and take as prescribed.  You may also take over-the-counter Mucinex to thin your mucus and Tylenol and Motrin as needed for fever and pain.  Thank you for letting us treat you today. After reviewing your labs and imaging, I feel you are safe to go home. Please follow up with your PCP in the next several days and provide them with your records from this visit. Return to the Emergency Room if pain becomes severe or symptoms worsen.

## 2023-12-27 NOTE — ED Provider Notes (Signed)
[place] EMERGENCY DEPARTMENT AT [facility] Provider Note   CSN: [id number] Arrival date & time: 12/27/23  0901     History  Chief Complaint  Patient presents with   URI    Christina Booth is a 48 y.o. female presents today for 2 weeks of flulike symptoms.  Patient endorses cough, congestion, postnasal drip, headache, sinus pressure, body aches, fever, nausea, vomiting, shortness of breath, dizziness and ear pain with left greater than right.  Patient denies dysuria, hematuria, hematemesis, sore throat, chest pain, weakness, or abdominal pain.  Patient states that she has been taking Mucinex, NyQuil, DayQuil, and emergency with minimal relief.  Patient was seen 2 weeks ago and told she had a virus but states that she has not gotten any better.   URI Presenting symptoms: congestion, cough, ear pain, fever and rhinorrhea   Associated symptoms: headaches and sinus pain        Home Medications Prior to Admission medications   Medication Sig Start Date End Date Taking? Authorizing Provider  fluticasone (FLONASE) 50 MCG/ACT nasal spray Place 2 sprays into both nostrils daily. 12/27/23  Yes Dolphus Jenny, PA-C  meclizine (ANTIVERT) 25 MG tablet Take 1 tablet (25 mg total) by mouth 3 (three) times daily as needed for dizziness. 12/27/23  Yes Dolphus Jenny, PA-C  ondansetron (ZOFRAN) 4 MG tablet Take 1 tablet (4 mg total) by mouth every 8 (eight) hours as needed for nausea or vomiting. 12/27/23  Yes Dolphus Jenny, PA-C  amoxicillin-clavulanate (AUGMENTIN) 875-125 MG tablet Take 1 tablet by mouth 2 (two) times daily. 12/10/20   Henson, Vickie L, NP-C  Ascorbic Acid (VITAMIN C PO) Take 1 tablet by mouth daily.    [provider]  busPIRone (BUSPAR) 10 MG tablet Take 10 mg by mouth 3 (three) times daily as needed. 07/10/23   [provider]  Cyanocobalamin (VITAMIN B 12) 500 MCG TABS Take 500 mcg by mouth daily.    [provider]  diclofenac (VOLTAREN)  75 MG EC tablet Take 1 tablet (75 mg total) by mouth 2 (two) times daily. Patient not taking: No sig reported 01/03/20   Hetty Blend L, NP-C  HYDROcodone-acetaminophen (NORCO/VICODIN) 5-325 MG tablet Take 1 tablet by mouth every 6 (six) hours as needed for moderate pain. Patient not taking: No sig reported 12/06/20   Valeria Batman, MD  ibuprofen (ADVIL) 800 MG tablet Take 1 tablet (800 mg total) by mouth every 8 (eight) hours as needed. 12/10/20   Henson, Vickie L, NP-C  traZODone (DESYREL) 50 MG tablet Take 50 mg by mouth daily as needed. 07/10/23   [provider]      Allergies    Penicillins    Review of Systems   Review of Systems  Constitutional:  Positive for fever.  HENT:  Positive for congestion, ear pain, postnasal drip, rhinorrhea and sinus pain.   Respiratory:  Positive for cough and shortness of breath.   Gastrointestinal:  Positive for nausea and vomiting.  Neurological:  Positive for dizziness and headaches.    Physical Exam Updated Vital Signs BP (!) 156/110 (BP Location: Left Arm)   Pulse 78   Temp 98.2 F (36.8 C) (Oral)   Resp 18   Ht 5\' 6"  (1.676 m)   Wt 108.9 kg   LMP 11/12/2023   SpO2 100%   BMI 38.74 kg/m  Physical Exam Vitals and nursing note reviewed.  Constitutional:      General: She is not in  acute distress.    Appearance: She is well-developed. She is obese. She is ill-appearing. She is not toxic-appearing or diaphoretic.  HENT:     Head: Normocephalic and atraumatic.     Comments: Sinus tenderness to maxillary and frontal sinuses    Right Ear: Tympanic membrane normal. No mastoid tenderness.     Left Ear: A middle ear effusion is present. No mastoid tenderness.     Nose: Congestion and rhinorrhea present.     Mouth/Throat:     Mouth: Mucous membranes are moist.     Pharynx: Oropharynx is clear.  Eyes:     Extraocular Movements: Extraocular movements intact.     Conjunctiva/sclera: Conjunctivae normal.  Cardiovascular:     Rate  and Rhythm: Normal rate and regular rhythm.     Pulses: Normal pulses.     Heart sounds: Normal heart sounds. No murmur heard. Pulmonary:     Effort: Pulmonary effort is normal. No respiratory distress.     Breath sounds: Normal breath sounds.  Abdominal:     Palpations: Abdomen is soft.     Tenderness: There is no abdominal tenderness.  Musculoskeletal:        General: No swelling.     Cervical back: Normal range of motion and neck supple.  Skin:    General: Skin is warm and dry.     Capillary Refill: Capillary refill takes less than 2 seconds.  Neurological:     General: No focal deficit present.     Mental Status: She is alert.     Motor: No weakness.  Psychiatric:        Mood and Affect: Mood normal.     ED Results / Procedures / Treatments   Labs (all labs ordered are listed, but only abnormal results are displayed) Labs Reviewed  RESP PANEL BY RT-PCR (RSV, FLU A&B, COVID)  RVPGX2 - Abnormal; Notable for the following components:      Result Value   Influenza A by PCR POSITIVE (*)    All other components within normal limits  PREGNANCY, URINE    EKG None  Radiology DG Chest 2 View Result Date: 12/27/2023 CLINICAL DATA:  Cough, shortness of breath, and fever 3 weeks. EXAM: CHEST - 2 VIEW COMPARISON:  12/03/2023 FINDINGS: The heart size and mediastinal contours are within normal limits. Both lungs are clear. The visualized skeletal structures are unremarkable. IMPRESSION: No active cardiopulmonary disease. Electronically Signed   By: Danae Orleans M.D.   On: 12/27/2023 09:50    Procedures Procedures    Medications Ordered in ED Medications  meclizine (ANTIVERT) tablet 25 mg (25 mg Oral Given 12/27/23 1020)    ED Course/ Medical Decision Making/ A&P                                 Medical Decision Making  This patient presents to the ED with chief complaint(s) of URI symptoms with pertinent past medical history of none which further complicates the presenting  complaint. The complaint involves an extensive differential diagnosis and also carries with it a high risk of complications and morbidity.    The differential diagnosis includes COVID, flu, RSV, pneumonia, upper respiratory infection  Additional history obtained: Records reviewed Primary Care Documents  ED Course and Reassessment:   Independent labs interpretation:  The following labs were independently interpreted:  Respiratory panel: Influenza A positive EKG: Sinus rhythm, probable LVH  Independent visualization of imaging: -  I independently visualized the following imaging with scope of interpretation limited to determining acute life threatening conditions related to emergency care: Chest Xray, which revealed no active cardiopulmonary disease  Consultation: - Consulted or discussed management/test interpretation w/ external professional: None  Consideration for admission or further workup: Considered for mission further workup however patient's vital signs, physical exam, labs, and imaging were reassuring.  Patient's symptoms likely due to influenza A infection.  Patient was able to tolerate oral intake without difficulty prior to discharge.  Patient will be given outpatient symptomatic management to include Zofran, Antivert, Flonase, and over-the-counter Mucinex, Tylenol and Motrin as needed.        Final Clinical Impression(s) / ED Diagnoses Final diagnoses:  Influenza A    Rx / DC Orders ED Discharge Orders          Ordered    meclizine (ANTIVERT) 25 MG tablet  3 times daily PRN        12/27/23 1016    ondansetron (ZOFRAN) 4 MG tablet  Every 8 hours PRN        12/27/23 1016    fluticasone (FLONASE) 50 MCG/ACT nasal spray  Daily        12/27/23 1016              Dolphus Jenny, PA-C 12/27/23 1052    Virgina Norfolk, DO 12/27/23 1412

## 2023-12-27 NOTE — ED Notes (Signed)
Pt given cup of water

## 2024-01-01 ENCOUNTER — Ambulatory Visit (INDEPENDENT_AMBULATORY_CARE_PROVIDER_SITE_OTHER): Payer: Self-pay | Admitting: Family Medicine

## 2024-01-01 ENCOUNTER — Encounter: Payer: Self-pay | Admitting: Family Medicine

## 2024-01-01 VITALS — BP 156/97 | HR 102 | Temp 98.1°F | Resp 18 | Ht 65.0 in | Wt 277.5 lb

## 2024-01-01 DIAGNOSIS — J111 Influenza due to unidentified influenza virus with other respiratory manifestations: Secondary | ICD-10-CM

## 2024-01-01 DIAGNOSIS — H6590 Unspecified nonsuppurative otitis media, unspecified ear: Secondary | ICD-10-CM

## 2024-01-01 DIAGNOSIS — Z7689 Persons encountering health services in other specified circumstances: Secondary | ICD-10-CM

## 2024-01-01 DIAGNOSIS — Z1231 Encounter for screening mammogram for malignant neoplasm of breast: Secondary | ICD-10-CM

## 2024-01-01 MED ORDER — MONTELUKAST SODIUM 10 MG PO TABS: 10.0000 mg | ORAL_TABLET | Freq: Every day | ORAL | 1 refills | Status: AC

## 2024-01-01 NOTE — Progress Notes (Signed)
New Patient Office Visit  Subjective    Patient ID: Christina Booth, female    DOB: August 14, 1976  Age: 48 y.o. MRN: [id number]  CC:  Chief Complaint  Patient presents with   Establish Care    Patient is here to establish care with new PCP , Patient would like to discuss a constant whooshing noise in her left ear for about a month now. She would also like a referral for a mammogram because she has a history of getting cyst in her breast.     HPI Christina Booth presents to establish care  Pt reports left ear symptoms. She has had URI the beginning of the month and then has had flu this week. She reports she's had left ear symptoms that consist of fluid in the ear. She reports it's hard to breath through her nose. She went to urgent care and didn't receive anything for flu this past week.  She also reports rhinorrhea.   She also would like mammogram referral.   Outpatient Encounter Medications as of 01/01/2024  Medication Sig   Ascorbic Acid (VITAMIN C PO) Take 1 tablet by mouth daily.   Cyanocobalamin (VITAMIN B 12) 500 MCG TABS Take 500 mcg by mouth daily.   fluticasone (FLONASE) 50 MCG/ACT nasal spray Place 2 sprays into both nostrils daily.   busPIRone (BUSPAR) 10 MG tablet Take 10 mg by mouth 3 (three) times daily as needed. (Patient not taking: Reported on 01/01/2024)   diclofenac (VOLTAREN) 75 MG EC tablet Take 1 tablet (75 mg total) by mouth 2 (two) times daily. (Patient not taking: Reported on 12/10/2020)   escitalopram (LEXAPRO) 5 MG tablet Take 5 mg by mouth daily. (Patient not taking: Reported on 01/01/2024)   meclizine (ANTIVERT) 25 MG tablet Take 1 tablet (25 mg total) by mouth 3 (three) times daily as needed for dizziness. (Patient not taking: Reported on 01/01/2024)   ondansetron (ZOFRAN) 4 MG tablet Take 1 tablet (4 mg total) by mouth every 8 (eight) hours as needed for nausea or vomiting. (Patient not taking: Reported on 01/01/2024)   traZODone (DESYREL) 50 MG tablet  Take 50 mg by mouth daily as needed. (Patient not taking: Reported on 01/01/2024)   [DISCONTINUED] amoxicillin-clavulanate (AUGMENTIN) 875-125 MG tablet Take 1 tablet by mouth 2 (two) times daily.   [DISCONTINUED] HYDROcodone-acetaminophen (NORCO/VICODIN) 5-325 MG tablet Take 1 tablet by mouth every 6 (six) hours as needed for moderate pain. (Patient not taking: No sig reported)   [DISCONTINUED] ibuprofen (ADVIL) 800 MG tablet Take 1 tablet (800 mg total) by mouth every 8 (eight) hours as needed.   No facility-administered encounter medications on file as of 01/01/2024.    Past Medical History:  Diagnosis Date   Back pain    Morbid obesity (HCC) 12/29/2018   Osteoarthritis 07/09/2018   Prediabetes 12/30/2018   Vitamin D deficiency 12/30/2018    Past Surgical History:  Procedure Laterality Date   btl     CHOLECYSTECTOMY     UPPER GI ENDOSCOPY  04/14/2018   normal    Family History  Problem Relation Age of Onset   Cancer Mother    Hypertension Father    Diabetes Father    Dementia Father    Cancer Sister     Social History   Socioeconomic History   Marital status: Married    Spouse name: Not on file   Number of children: Not on file   Years of education: Not on file   Highest education  level: Not on file  Occupational History   Not on file  Tobacco Use   Smoking status: Never    Passive exposure: Never   Smokeless tobacco: Never  Vaping Use   Vaping status: Never Used  Substance and Sexual Activity   Alcohol use: Yes    Comment: occ   Drug use: No   Sexual activity: Yes    Birth control/protection: Surgical  Other Topics Concern   Not on file  Social History Narrative   Not on file   Social Drivers of Health   Financial Resource Strain: Not on file  Food Insecurity: Not on file  Transportation Needs: Not on file  Physical Activity: Not on file  Stress: Not on file  Social Connections: Unknown (08/21/2023)   Received from [facility]   Social Network     Social Network: Not on file  Intimate Partner Violence: Unknown (08/21/2023)   Received from [facility]   HITS    Physically Hurt: Not on file    Insult or Talk Down To: Not on file    Threaten Physical Harm: Not on file    Scream or Curse: Not on file    Review of Systems  HENT:         Left ear noise  All other systems reviewed and are negative.      Objective    BP (!) 156/97   Pulse (!) 102   Temp 98.1 F (36.7 C) (Oral)   Resp 18   Ht 5\' 5"  (1.651 m)   Wt 277 lb 8 oz (125.9 kg)   LMP 11/12/2023   SpO2 98%   BMI 46.18 kg/m   Physical Exam Vitals and nursing note reviewed.  Constitutional:      Appearance: Normal appearance. She is normal weight.  HENT:     Head: Normocephalic and atraumatic.     Right Ear: Tympanic membrane, ear canal and external ear normal.     Left Ear: Tympanic membrane, ear canal and external ear normal.     Nose: Nose normal.     Mouth/Throat:     Mouth: Mucous membranes are moist.     Pharynx: Oropharynx is clear.  Eyes:     Conjunctiva/sclera: Conjunctivae normal.     Pupils: Pupils are equal, round, and reactive to light.  Cardiovascular:     Rate and Rhythm: Normal rate and regular rhythm.     Pulses: Normal pulses.     Heart sounds: Normal heart sounds.  Pulmonary:     Effort: Pulmonary effort is normal.     Breath sounds: Normal breath sounds.  Skin:    General: Skin is warm.     Capillary Refill: Capillary refill takes less than 2 seconds.  Neurological:     General: No focal deficit present.     Mental Status: She is alert and oriented to person, place, and time. Mental status is at baseline.  Psychiatric:        Mood and Affect: Mood normal.        Behavior: Behavior normal.        Thought Content: Thought content normal.        Judgment: Judgment normal.       Assessment & Plan:   Problem List Items Addressed This Visit   None Encounter to establish care with new doctor  Screening mammogram for breast  cancer -     3D Screening Mammogram, Left and Right; Future  Influenza  Fluid collection  of middle ear -     Montelukast Sodium; Take 1 tablet (10 mg total) by mouth at bedtime.  Dispense: 30 tablet; Refill: 1   Pt with recent URI Jan 2 with Flu A last week. Likely still some residual sinus congestion causing fluid in her ear. Treat with Singulair 10mg  at night. To continue taking the Flonase daily. Refer for mammogram. See back for CPE/pap  No follow-ups on file.   Suzan Slick, MD

## 2024-01-07 ENCOUNTER — Encounter: Payer: Self-pay | Admitting: Family Medicine

## 2024-01-22 ENCOUNTER — Ambulatory Visit: Payer: Self-pay

## 2024-01-28 ENCOUNTER — Ambulatory Visit: Payer: Self-pay
